# Patient Record
Sex: Male | Born: 1955 | Race: White | Hispanic: No | Marital: Married | Smoking: Never smoker
Health system: Southern US, Community
[De-identification: ages and names within clinical notes are randomized; demographics above are authoritative.]

## PROBLEM LIST (undated history)

## (undated) DIAGNOSIS — F329 Major depressive disorder, single episode, unspecified: Secondary | ICD-10-CM

## (undated) DIAGNOSIS — E119 Type 2 diabetes mellitus without complications: Secondary | ICD-10-CM

## (undated) DIAGNOSIS — I10 Essential (primary) hypertension: Secondary | ICD-10-CM

## (undated) DIAGNOSIS — F32A Depression, unspecified: Secondary | ICD-10-CM

## (undated) DIAGNOSIS — C801 Malignant (primary) neoplasm, unspecified: Secondary | ICD-10-CM

## (undated) HISTORY — PX: NEPHRECTOMY: SHX65

## (undated) HISTORY — PX: CHOLECYSTECTOMY: SHX55

## (undated) HISTORY — PX: ELBOW SURGERY: SHX618

---

## 2010-08-06 HISTORY — PX: OTHER SURGICAL HISTORY: SHX169

## 2013-02-04 ENCOUNTER — Observation Stay (HOSPITAL_COMMUNITY)
Admission: EM | Admit: 2013-02-04 | Discharge: 2013-02-06 | Disposition: A | Discharge status: Home / Self Care | Payer: Medicaid Other | Attending: Family Medicine | Admitting: Family Medicine

## 2013-02-04 ENCOUNTER — Encounter (HOSPITAL_COMMUNITY): Payer: Self-pay | Admitting: Emergency Medicine

## 2013-02-04 ENCOUNTER — Emergency Department (HOSPITAL_COMMUNITY): Payer: Medicaid Other

## 2013-02-04 DIAGNOSIS — F3289 Other specified depressive episodes: Secondary | ICD-10-CM | POA: Insufficient documentation

## 2013-02-04 DIAGNOSIS — Z79899 Other long term (current) drug therapy: Secondary | ICD-10-CM | POA: Insufficient documentation

## 2013-02-04 DIAGNOSIS — F329 Major depressive disorder, single episode, unspecified: Secondary | ICD-10-CM | POA: Diagnosis present

## 2013-02-04 DIAGNOSIS — M549 Dorsalgia, unspecified: Secondary | ICD-10-CM | POA: Diagnosis present

## 2013-02-04 DIAGNOSIS — E119 Type 2 diabetes mellitus without complications: Secondary | ICD-10-CM | POA: Insufficient documentation

## 2013-02-04 DIAGNOSIS — I10 Essential (primary) hypertension: Secondary | ICD-10-CM | POA: Diagnosis present

## 2013-02-04 DIAGNOSIS — R55 Syncope and collapse: Principal | ICD-10-CM | POA: Insufficient documentation

## 2013-02-04 HISTORY — DX: Major depressive disorder, single episode, unspecified: F32.9

## 2013-02-04 HISTORY — DX: Type 2 diabetes mellitus without complications: E11.9

## 2013-02-04 HISTORY — DX: Essential (primary) hypertension: I10

## 2013-02-04 HISTORY — DX: Depression, unspecified: F32.A

## 2013-02-04 LAB — CBC WITH DIFFERENTIAL/PLATELET
Basophils Absolute: 0 10*3/uL (ref 0.0–0.1)
Basophils Relative: 0 % (ref 0–1)
Eosinophils Absolute: 0.2 10*3/uL (ref 0.0–0.7)
Eosinophils Relative: 3 % (ref 0–5)
HCT: 37.3 % — ABNORMAL LOW (ref 39.0–52.0)
Hemoglobin: 12.8 g/dL — ABNORMAL LOW (ref 13.0–17.0)
Lymphocytes Relative: 25 % (ref 12–46)
Lymphs Abs: 2 10*3/uL (ref 0.7–4.0)
MCH: 28.2 pg (ref 26.0–34.0)
MCHC: 34.3 g/dL (ref 30.0–36.0)
MCV: 82.2 fL (ref 78.0–100.0)
Monocytes Absolute: 0.4 10*3/uL (ref 0.1–1.0)
Monocytes Relative: 5 % (ref 3–12)
Neutro Abs: 5.5 10*3/uL (ref 1.7–7.7)
Neutrophils Relative %: 67 % (ref 43–77)
Platelets: 157 10*3/uL (ref 150–400)
RBC: 4.54 MIL/uL (ref 4.22–5.81)
RDW: 14.2 % (ref 11.5–15.5)
WBC: 8.2 10*3/uL (ref 4.0–10.5)

## 2013-02-04 LAB — URINALYSIS, ROUTINE W REFLEX MICROSCOPIC
Bilirubin Urine: NEGATIVE
Glucose, UA: NEGATIVE mg/dL
Hgb urine dipstick: NEGATIVE
Ketones, ur: NEGATIVE mg/dL
Leukocytes, UA: NEGATIVE
Nitrite: NEGATIVE
Protein, ur: NEGATIVE mg/dL
Specific Gravity, Urine: 1.005 (ref 1.005–1.030)
Urobilinogen, UA: 4 mg/dL — ABNORMAL HIGH (ref 0.0–1.0)
pH: 6.5 (ref 5.0–8.0)

## 2013-02-04 LAB — TYPE AND SCREEN
ABO/RH(D): O NEG
Antibody Screen: NEGATIVE

## 2013-02-04 LAB — TROPONIN I
Troponin I: <0.3 ng/mL (ref <0.30)
Troponin I: <0.3 ng/mL (ref <0.30)

## 2013-02-04 LAB — BASIC METABOLIC PANEL
BUN: 9 mg/dL (ref 6–23)
CO2: 25 mEq/L (ref 19–32)
Calcium: 9.4 mg/dL (ref 8.4–10.5)
Chloride: 102 mEq/L (ref 96–112)
Creatinine, Ser: 1.19 mg/dL (ref 0.50–1.35)
GFR calc Af Amer: 77 mL/min — ABNORMAL LOW (ref >90)
GFR calc non Af Amer: 67 mL/min — ABNORMAL LOW (ref >90)
Glucose, Bld: 80 mg/dL (ref 70–99)
Potassium: 4.2 mEq/L (ref 3.5–5.1)
Sodium: 139 mEq/L (ref 135–145)

## 2013-02-04 LAB — CG4 I-STAT (LACTIC ACID): Lactic Acid, Venous: 2.16 mmol/L (ref 0.5–2.2)

## 2013-02-04 LAB — ABO/RH: ABO/RH(D): O NEG

## 2013-02-04 MED ORDER — SODIUM CHLORIDE 0.9 % IJ SOLN
3.0000 mL | Freq: Two times a day (BID) | INTRAMUSCULAR | Status: DC
  Administered 2013-02-04 – 2013-02-05 (×2): 3 mL via INTRAVENOUS

## 2013-02-04 MED ORDER — MORPHINE SULFATE 4 MG/ML IJ SOLN
8.0000 mg | Freq: Once | INTRAMUSCULAR | Status: AC
  Administered 2013-02-04: 8 mg via INTRAVENOUS
  Filled 2013-02-04: qty 2

## 2013-02-04 MED ORDER — TEMAZEPAM 15 MG PO CAPS
30.0000 mg | ORAL_CAPSULE | Freq: Every day | ORAL | Status: DC
Start: 2013-02-04 — End: 2013-02-06
  Administered 2013-02-04 – 2013-02-05 (×2): 30 mg via ORAL
  Filled 2013-02-04 (×2): qty 2

## 2013-02-04 MED ORDER — SODIUM CHLORIDE 0.9 % IV BOLUS (SEPSIS)
1000.0000 mL | Freq: Once | INTRAVENOUS | Status: AC
  Administered 2013-02-04: 1000 mL via INTRAVENOUS

## 2013-02-04 MED ORDER — IOHEXOL 350 MG/ML SOLN
100.0000 mL | Freq: Once | INTRAVENOUS | Status: AC | PRN
  Administered 2013-02-04: 100 mL via INTRAVENOUS

## 2013-02-04 MED ORDER — GABAPENTIN 300 MG PO CAPS
300.0000 mg | ORAL_CAPSULE | Freq: Three times a day (TID) | ORAL | Status: DC
  Administered 2013-02-04 – 2013-02-06 (×5): 300 mg via ORAL
  Filled 2013-02-04 (×8): qty 1

## 2013-02-04 MED ORDER — MORPHINE SULFATE 4 MG/ML IJ SOLN
4.0000 mg | Freq: Once | INTRAMUSCULAR | Status: AC
  Administered 2013-02-04: 4 mg via INTRAVENOUS
  Filled 2013-02-04: qty 1

## 2013-02-04 MED ORDER — TRAZODONE HCL 150 MG PO TABS
150.0000 mg | ORAL_TABLET | Freq: Two times a day (BID) | ORAL | Status: DC
  Administered 2013-02-04: 150 mg via ORAL
  Filled 2013-02-04 (×3): qty 1

## 2013-02-04 MED ORDER — INSULIN ASPART 100 UNIT/ML ~~LOC~~ SOLN: 0.0000 [IU] | Freq: Three times a day (TID) | SUBCUTANEOUS | Status: DC

## 2013-02-04 MED ORDER — SODIUM CHLORIDE 0.9 % IV SOLN: INTRAVENOUS | Status: DC

## 2013-02-04 MED ORDER — MORPHINE SULFATE 4 MG/ML IJ SOLN
4.0000 mg | INTRAMUSCULAR | Status: DC | PRN
  Administered 2013-02-05: 4 mg via INTRAVENOUS
  Filled 2013-02-04: qty 1

## 2013-02-04 MED ORDER — BUSPIRONE HCL 10 MG PO TABS
10.0000 mg | ORAL_TABLET | Freq: Two times a day (BID) | ORAL | Status: DC
  Administered 2013-02-04 – 2013-02-06 (×4): 10 mg via ORAL
  Filled 2013-02-04 (×5): qty 1

## 2013-02-04 MED ORDER — ACETAMINOPHEN 650 MG RE SUPP: 650.0000 mg | Freq: Four times a day (QID) | RECTAL | Status: DC | PRN

## 2013-02-04 MED ORDER — LISINOPRIL 5 MG PO TABS
5.0000 mg | ORAL_TABLET | Freq: Every day | ORAL | Status: DC
  Administered 2013-02-04 – 2013-02-05 (×2): 5 mg via ORAL
  Filled 2013-02-04 (×3): qty 1

## 2013-02-04 MED ORDER — IOHEXOL 350 MG/ML SOLN: 100.0000 mL | Freq: Once | INTRAVENOUS | Status: DC | PRN

## 2013-02-04 MED ORDER — HEPARIN SODIUM (PORCINE) 5000 UNIT/ML IJ SOLN
5000.0000 [IU] | Freq: Three times a day (TID) | INTRAMUSCULAR | Status: DC
  Administered 2013-02-04 – 2013-02-06 (×5): 5000 [IU] via SUBCUTANEOUS
  Filled 2013-02-04 (×8): qty 1

## 2013-02-04 MED ORDER — ACETAMINOPHEN 325 MG PO TABS: 650.0000 mg | ORAL_TABLET | Freq: Four times a day (QID) | ORAL | Status: DC | PRN

## 2013-02-04 MED ORDER — ASPIRIN EC 81 MG PO TBEC
81.0000 mg | DELAYED_RELEASE_TABLET | Freq: Every day | ORAL | Status: DC
  Administered 2013-02-04 – 2013-02-06 (×3): 81 mg via ORAL
  Filled 2013-02-04 (×3): qty 1

## 2013-02-04 MED ORDER — LURASIDONE HCL 40 MG PO TABS
40.0000 mg | ORAL_TABLET | Freq: Every day | ORAL | Status: DC
  Administered 2013-02-05 – 2013-02-06 (×2): 40 mg via ORAL
  Filled 2013-02-04 (×3): qty 1

## 2013-02-04 MED ORDER — METFORMIN HCL 500 MG PO TABS
500.0000 mg | ORAL_TABLET | Freq: Two times a day (BID) | ORAL | Status: DC
  Filled 2013-02-04 (×3): qty 1

## 2013-02-04 MED ORDER — DOXEPIN HCL 50 MG PO CAPS
50.0000 mg | ORAL_CAPSULE | Freq: Every day | ORAL | Status: DC
  Administered 2013-02-04 – 2013-02-05 (×2): 50 mg via ORAL
  Filled 2013-02-04 (×3): qty 1

## 2013-02-04 MED ORDER — ONDANSETRON HCL 4 MG/2ML IJ SOLN
4.0000 mg | Freq: Once | INTRAMUSCULAR | Status: AC
  Administered 2013-02-04: 4 mg via INTRAVENOUS
  Filled 2013-02-04: qty 2

## 2013-02-04 NOTE — ED Notes (Signed)
Pt states he passed out at his sisters house, he felt lightheaded and dizzy, but does not think he hit his head. Pt also states he had some shortness of breath but no more than usual.

## 2013-02-04 NOTE — ED Provider Notes (Signed)
History    57 year old male w/ syncope x2. Woke up this morning and just didn't feel like normal self. Fatigue and mild nausea. Was at sister's house walking when he passed out. Preceeded by a few seconds of diaphoresis and nausea before he fell to the ground. This was witnessed by family. He states that they told him that he was unconscious for a period of about a minute. No seizure activity. No oral trauma. No incontinence. Quick return to baseline. Similar symptom shortly later while in a seated position. He denies any preceding pain, but that after the first syncopal event he noticed pain in his back from between his shoulder blades extending down to lower back. Feels like a throbbing. Has been persistent since. No SOB. No unusual leg pain or swelling. Hx of HTN and DM. Denies any known Hx of CAD.   CSN: [id number] Arrival date & time 02/04/13  1115  First MD Initiated Contact with Patient 02/04/13 1134     Chief Complaint  Patient presents with  . Loss of Consciousness  . Back Pain   (Consider location/radiation/quality/duration/timing/severity/associated sxs/prior Treatment) HPI Past Medical History  Diagnosis Date  . Hypertension   . Diabetes mellitus without complication   . Depression    History reviewed. No pertinent past surgical history. History reviewed. No pertinent family history. History  Substance Use Topics  . Smoking status: Never Smoker   . Smokeless tobacco: Not on file  . Alcohol Use: No    Review of Systems All systems reviewed and negative, other than as noted in HPI.   Allergies  Penicillins  Home Medications  No current outpatient prescriptions on file. BP 144/105  Pulse 115  Temp(Src) 98.3 F (36.8 C) (Oral)  Resp 18  SpO2 96% Physical Exam  Nursing note and vitals reviewed. Constitutional: No distress.  Laying in bed. NAD. Obese.   HENT:  Head: Normocephalic and atraumatic.  Eyes: Conjunctivae are normal. Right eye exhibits no  discharge. Left eye exhibits no discharge.  Neck: Neck supple.  Cardiovascular: Regular rhythm and normal heart sounds.  Exam reveals no gallop and no friction rub.   No murmur heard. Tachycardic. No murmur appreciated by exam limited by robust body habitus.   Pulmonary/Chest: Effort normal and breath sounds normal. No respiratory distress.  Abdominal: Soft. He exhibits no distension. There is no tenderness.  Musculoskeletal: He exhibits no edema and no tenderness.  Neurological: He is alert.  Skin: Skin is warm and dry. He is not diaphoretic.  Psychiatric: He has a normal mood and affect. His behavior is normal. Thought content normal.    ED Course  Procedures (including critical care time) Labs Reviewed  BASIC METABOLIC PANEL - Abnormal; Notable for the following:    GFR calc non Af Amer 67 (*)    GFR calc Af Amer 77 (*)    All other components within normal limits  URINALYSIS, ROUTINE W REFLEX MICROSCOPIC - Abnormal; Notable for the following:    Urobilinogen, UA 4.0 (*)    All other components within normal limits  TROPONIN I  CG4 I-STAT (LACTIC ACID)  TYPE AND SCREEN  ABO/RH   Dg Chest 2 View  02/04/2013   *RADIOLOGY REPORT*  Clinical Data: Syncope, back pain  CHEST - 2 VIEW  Comparison: Portable chest x-ray of 12/08/2012  Findings: No active infiltrate or effusion is seen.  There is some prominence of the right paratracheal soft tissues, but this appears unchanged compared to the chest x-ray from November 2013  and probably represents overlapping vascular structures.  Otherwise mediastinal contours remained stable.  The heart is mildly enlarged and stable.  No bony abnormality is seen. Slight irregularity of the mid right clavicle may be due to prior trauma.  IMPRESSION: No active lung disease.  Stable mild cardiomegaly.   Original Report Authenticated By: Dwyane Dee, M.D.   Ct Angio Chest W/cm &/or Wo Cm  02/04/2013   *RADIOLOGY REPORT*  Clinical Data:  Rule out aortic dissection   CT ANGIOGRAPHY CHEST, ABDOMEN AND PELVIS  Technique:  Multidetector CT imaging through the chest, abdomen and pelvis was performed using the standard protocol during bolus administration of intravenous contrast.  Multiplanar reconstructed images including MIPs were obtained and reviewed to evaluate the vascular anatomy.  Contrast: OMNIPAQUE IOHEXOL 350 MG/ML SOLN  Comparison:   None.  CTA CHEST  Findings:  No pleural effusion identified.  There is no airspace consolidation or atelectasis.  No pulmonary nodule or mass identified.  Trachea is patent and is midline.  There is mild cardiac enlargement.  No pericardial effusion.  No mediastinal or hilar adenopathy.  The main pulmonary artery appears patent.  No lobar or proximal segmental filling defects identified to suggest an acute pulmonary embolus.  The thoracic aorta appears normal in course and caliber.  No evidence for dissection.  There is no axillary or supraclavicular adenopathy.  The visualized bony structures are remarkable for age indeterminate right anterior second rib fracture.  There is spondylosis noted within the thoracic spine.   Review of the MIP images confirms the above findings.  IMPRESSION:  1.  No evidence for aortic dissection.  CTA ABDOMEN AND PELVIS  Findings:  No focal liver abnormality identified.  Prior cholecystectomy.  No biliary dilatation.  Normal appearance of the pancreas.  The spleen is normal.  The right adrenal gland is normal.  Previous left nephrectomy.  Again noted is a mass within the left renal bed.  This measures 8.1 x 5.7 cm and is not significantly changed when compared with the previous exam.  Areas of macroscopic fat are identified within this mass as before.  Cyst within the mid pole of the right kidney measures 1.4 cm, image 124/series 4.  The urinary bladder appears normal.  Prostate gland and seminal vesicles are unremarkable.  Normal caliber of the abdominal aorta.  No aneurysm or evidence of aortic  dissection.  No upper abdominal adenopathy identified. There is no pelvic or inguinal adenopathy.  No free fluid or abnormal fluid collections identified within the abdomen or pelvis.  The stomach appears normal.  The small bowel loops have a normal course and caliber without evidence for obstruction.  The appendix is visualized and appears normal.  The normal appearance of the colon.  Review of the visualized osseous structures is significant for lumbar degenerative disc disease.  This is most severe at L5-S1. Hemangioma is identified within the L5 vertebra.   Review of the MIP images confirms the above findings.  IMPRESSION:  1.  No evidence for aortic dissection. 2.  No significant change and size of the left sided retroperitoneal mass situated in the left renal fossa. 2 year stability favors a benign abnormality.  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   Original Report Authenticated By: Signa Kell, M.D.   Ct Cta Abd/pel W/cm &/or W/o Cm  02/04/2013   *RADIOLOGY REPORT*  Clinical Data:  Rule out aortic dissection  CT ANGIOGRAPHY CHEST, ABDOMEN AND PELVIS  Technique:  Multidetector CT  imaging through the chest, abdomen and pelvis was performed using the standard protocol during bolus administration of intravenous contrast.  Multiplanar reconstructed images including MIPs were obtained and reviewed to evaluate the vascular anatomy.  Contrast: OMNIPAQUE IOHEXOL 350 MG/ML SOLN  Comparison:   None.  CTA CHEST  Findings:  No pleural effusion identified.  There is no airspace consolidation or atelectasis.  No pulmonary nodule or mass identified.  Trachea is patent and is midline.  There is mild cardiac enlargement.  No pericardial effusion.  No mediastinal or hilar adenopathy.  The main pulmonary artery appears patent.  No lobar or proximal segmental filling defects identified to suggest an acute pulmonary embolus.  The thoracic aorta appears normal in course and caliber.   No evidence for dissection.  There is no axillary or supraclavicular adenopathy.  The visualized bony structures are remarkable for age indeterminate right anterior second rib fracture.  There is spondylosis noted within the thoracic spine.   Review of the MIP images confirms the above findings.  IMPRESSION:  1.  No evidence for aortic dissection.  CTA ABDOMEN AND PELVIS  Findings:  No focal liver abnormality identified.  Prior cholecystectomy.  No biliary dilatation.  Normal appearance of the pancreas.  The spleen is normal.  The right adrenal gland is normal.  Previous left nephrectomy.  Again noted is a mass within the left renal bed.  This measures 8.1 x 5.7 cm and is not significantly changed when compared with the previous exam.  Areas of macroscopic fat are identified within this mass as before.  Cyst within the mid pole of the right kidney measures 1.4 cm, image 124/series 4.  The urinary bladder appears normal.  Prostate gland and seminal vesicles are unremarkable.  Normal caliber of the abdominal aorta.  No aneurysm or evidence of aortic dissection.  No upper abdominal adenopathy identified. There is no pelvic or inguinal adenopathy.  No free fluid or abnormal fluid collections identified within the abdomen or pelvis.  The stomach appears normal.  The small bowel loops have a normal course and caliber without evidence for obstruction.  The appendix is visualized and appears normal.  The normal appearance of the colon.  Review of the visualized osseous structures is significant for lumbar degenerative disc disease.  This is most severe at L5-S1. Hemangioma is identified within the L5 vertebra.   Review of the MIP images confirms the above findings.  IMPRESSION:  1.  No evidence for aortic dissection. 2.  No significant change and size of the left sided retroperitoneal mass situated in the left renal fossa. 2 year stability favors a benign abnormality.  This may represent a large adrenal myelolipoma with  previous hemorrhage.   Original Report Authenticated By: Signa Kell, M.D.   EKG:  Rhythm: sinus tachycardia Vent. rate 115 BPM PR interval 166 ms QRS duration 90 ms QT/QTc 322/445 ms abnormal r wave progression ST segments: NS ST changes in III Comparison: none   1. Syncope     MDM  57yM with syncope x2 and back pain. Back pain is reproducible on palpation and may be related to his fall. Constellation of symptoms concerning for possible dissection though. Will CT to eval for this. EKG with sinus tach, otherwise reassuring.   CT w/o acute pathology. Tachycardia resolved. Admit for further eval of syncope. Discussed with family medicine.   Raeford Razor, MD 02/09/13 [phone]

## 2013-02-04 NOTE — H&P (Signed)
Family Medicine Teaching [facility] Admission History and Physical Service Pager: [phone]  Patient name: Kevin Zhang Medical record number: [id number] Date of birth: Aug 02, 1956 Age: 57 y.o. Gender: male  Primary Care Provider: [facility] - Dr. Alvester Zhang Consultants: None Code Status: Full  Chief Complaint: Syncope  Assessment and Plan: Kevin Zhang is a 57 y.o. year old male presenting with two syncopal episodes . PMH is significant for HTN, DM and depression.  # Syncope- Unprovoked syncopal episodes, as far as I can tell. His labs, vital signs and imaging are unremarkable. DDx includes arrythmia, vasovagal syncope, psychogenic syncope, seizure activity (witnessed syncope with no signs of seizure and rapid return to baseline), TIA, infectious process (less likely given normal lactic acid and non-ill appearing patient). Might also be situational since he was outside on a hot day moving around more than usual. Patient also appears to be on many psych meds, so concern for QT prolongation especially with Latuda.  - Admit to telemetry, attending Dr. Denny Levy - EKG shows QTc of 445 which is not prolonged but close, repeat if has any further episodes as well as in AM - Echo given long standing HTN. Patient states he had cardiac work up in the past that was unremarkable, but will look for structural abnormalities - Orthostatic vital signs on admission - Needs CBC (previous lab canceled) - Gentle IVF - Check TSH and troponin  - May consider outpatient cards follow up if work up negative  # Back Pain- Thoracic back pain, likely related to fall at time of syncope. Patient had a CT to rule out aortic dissection given his history, which was negative. - Treat pain with Tylenol and Kpad for symptomatic relief - Morphine only as needed for severe pain  # HTN- BP elevated on arrival to the ED, but has now stablizined - Continue home regimen of Lisinopril - Monitor per  floor protocol  # DM-  - A1C with next labs - Continue home metformin  - SSI for additional coverage  # Depression- On multiple agents, particularly at night. Well controlled, per patient. Followed by mental health. Denies missing any doses except today's doses. I am hesitant to change anything at this time. - Continue home regimen of Buspar 10mg  BID, Doxepin 50mg  qhs, Latuda 40mg  qam, Restoril 30mg  qhs and Trazodone 150mg  BID.   FEN/GI: Carb modified diet. NS @ 75 cc/hr Prophylaxis: Heparin sq  Disposition: Home pending further work up and observation  History of Present Illness: Kevin Zhang is a 57 y.o. year old male presenting with syncope.  Patient states that he was at his sisters house outside with the grandkids and had 2 syncopal episodes. He states the episodes were 15 minutes apart. Both episodes were witnessed, and witnesses state he just fell down. (no tremors, or signs of seizure activity.) Patient states he was told he was out about 2 minutes. He does not remember anything except his family members helping him stand up. He states he was hot and sweaty from being outside, but felt like a "cold sweat" before both episodes happened. Denies CP but states he felt his heart beating fast and felt light headed.  Patient states he has not had anything at all to eat today. He had a little bit to drink today including some water and a bottle of diet mountain dew. He has not taken any medications today.  On further questioning, patient states he had an episode like this about a year ago.  He went to [facility] in [place], [place] at that time. He states that he was observed overnight and nothing was found so he was sent home. He does not remember any inciting events at that time. Denies any history of cardiac problems other than chest pains a few years ago. States he had a full cardiac work up at [facility] which was negative including stress test and cardiac cath.   No recent  illnesses or fevers. No sick contacts.  Review Of Systems: Per HPI with the following additions: Headaches for the last week (wears CPAP at night which he states is working fine.) No rashes, bug bites or tick bites. Otherwise 12 point review of systems was performed and was unremarkable.  Patient Active Problem List   Diagnosis Date Noted  . Syncope 02/04/2013  . HTN (hypertension) 02/04/2013  . Diabetes mellitus 02/04/2013  . Depression 02/04/2013  . Back pain 02/04/2013   Past Medical History: Past Medical History  Diagnosis Date  . Hypertension   . Diabetes mellitus without complication   . Depression    Past Surgical History: Past Surgical History  Procedure Laterality Date  . Kidney removed  2012    Left renal cancer  . Elbow surgery      Left elbow to remove old ortho hardware  . Cholecystectomy     Social History: History  Substance Use Topics  . Smoking status: Never Smoker   . Smokeless tobacco: Not on file  . Alcohol Use: No   Additional social history:  History   Social History Narrative   Lives in [place], [place] with wife and roommate. Visiting sister in [place].     Please also refer to relevant sections of EMR.  Family History: Family History  Problem Relation Age of Onset  . Heart attack Mother 61  . Heart attack Father 70   Allergies and Medications: Allergies  Allergen Reactions  . Penicillins     childhood   Prior to Admission medications   Medication Sig Start Date End Date Taking? Authorizing Provider  busPIRone (BUSPAR) 5 MG tablet Take 10 mg by mouth 2 (two) times daily.   Yes Historical Provider, MD  clotrimazole (LOTRIMIN) 1 % cream Apply 1 application topically daily.   Yes Historical Provider, MD  doxepin (SINEQUAN) 50 MG capsule Take 50 mg by mouth at bedtime.   Yes Historical Provider, MD  gabapentin (NEURONTIN) 300 MG capsule Take 300 mg by mouth 3 (three) times daily.   Yes Historical Provider, MD  HYDROcodone-acetaminophen  (NORCO) 10-325 MG per tablet Take 1 tablet by mouth every 4 (four) hours as needed for pain.   Yes Historical Provider, MD  lisinopril (PRINIVIL,ZESTRIL) 5 MG tablet Take 5 mg by mouth daily.   Yes Historical Provider, MD  lurasidone (LATUDA) 40 MG TABS Take 40 mg by mouth daily with breakfast.   Yes Historical Provider, MD  metFORMIN (GLUCOPHAGE) 500 MG tablet Take 500 mg by mouth 2 (two) times daily with a meal.   Yes Historical Provider, MD  naproxen (NAPROSYN) 500 MG tablet Take 500 mg by mouth 2 (two) times daily with a meal.   Yes Historical Provider, MD  promethazine (PHENERGAN) 25 MG tablet Take 25 mg by mouth 3 (three) times daily as needed for nausea.   Yes Historical Provider, MD  temazepam (RESTORIL) 30 MG capsule Take 30 mg by mouth at bedtime.   Yes Historical Provider, MD  traZODone (DESYREL) 150 MG tablet Take 150 mg by mouth 2 (two)  times daily.   Yes Historical Provider, MD   Objective: BP 155/65  Pulse 83  Temp(Src) 98.3 F (36.8 C) (Oral)  Resp 18  Ht 5\' 10"  (1.778 m)  Wt 355 lb 3.2 oz (161.118 kg)  BMI 50.97 kg/m2  SpO2 95% Exam: General: Sitting up in bed, NAD. Watching TV with minimal eye contact HEENT: MMM. Edentulous. Wears glasses. Neck: No true bruit appreciated although left side more audible than right  Cardiovascular: RRR. No murmur or irregular beats Respiratory: CTAB, no wheezes Abdomen: Obese, soft, nontender Back: TTP of thoracic and lumbar paraspinal muscles Extremities: Moves all extremities. Large scars on left forearm and mild deformity of left hand. Trace edema lower extremities with some chronic skin changes. Right great toe erythematous from ingrown nail Neuro: Good grip on the right. Left grip decreased due to chronic atrophy from previous trauma. Otherwise, no focal findings. Psych: Answers questions appropriately. No tangential thoughts or pressured speech. Makes minimal eye contact.  Labs and Imaging: CBC BMET  No results found for this  basename: WBC, HGB, HCT, PLT,  in the last 168 hours  Recent Labs Lab 02/04/13 1144  NA 139  K 4.2  CL 102  CO2 25  BUN 9  CREATININE 1.19  GLUCOSE 80  CALCIUM 9.4    Dg Chest 2 View  02/04/2013   *RADIOLOGY REPORT*  Clinical Data: Syncope, back pain  CHEST - 2 VIEW  Comparison: Portable chest x-ray of 12/08/2012  Findings: No active infiltrate or effusion is seen.  There is some prominence of the right paratracheal soft tissues, but this appears unchanged compared to the chest x-ray from November 2013 and probably represents overlapping vascular structures.  Otherwise mediastinal contours remained stable.  The heart is mildly enlarged and stable.  No bony abnormality is seen. Slight irregularity of the mid right clavicle may be due to prior trauma.  IMPRESSION: No active lung disease.  Stable mild cardiomegaly.   Original Report Authenticated By: Dwyane Dee, M.D.   Ct Angio Chest W/cm &/or Wo Cm  02/04/2013   *RADIOLOGY REPORT*  Clinical Data:  Rule out aortic dissection  CT ANGIOGRAPHY CHEST, ABDOMEN AND PELVIS  Technique:  Multidetector CT imaging through the chest, abdomen and pelvis was performed using the standard protocol during bolus administration of intravenous contrast.  Multiplanar reconstructed images including MIPs were obtained and reviewed to evaluate the vascular anatomy.  Contrast: OMNIPAQUE IOHEXOL 350 MG/ML SOLN  Comparison:   None.  CTA CHEST  Findings:  No pleural effusion identified.  There is no airspace consolidation or atelectasis.  No pulmonary nodule or mass identified.  Trachea is patent and is midline.  There is mild cardiac enlargement.  No pericardial effusion.  No mediastinal or hilar adenopathy.  The main pulmonary artery appears patent.  No lobar or proximal segmental filling defects identified to suggest an acute pulmonary embolus.  The thoracic aorta appears normal in course and caliber.  No evidence for dissection.  There is no axillary or supraclavicular  adenopathy.  The visualized bony structures are remarkable for age indeterminate right anterior second rib fracture.  There is spondylosis noted within the thoracic spine.   Review of the MIP images confirms the above findings.  IMPRESSION:  1.  No evidence for aortic dissection.  CTA ABDOMEN AND PELVIS  Findings:  No focal liver abnormality identified.  Prior cholecystectomy.  No biliary dilatation.  Normal appearance of the pancreas.  The spleen is normal.  The right adrenal gland is normal.  Previous left nephrectomy.  Again noted is a mass within the left renal bed.  This measures 8.1 x 5.7 cm and is not significantly changed when compared with the previous exam.  Areas of macroscopic fat are identified within this mass as before.  Cyst within the mid pole of the right kidney measures 1.4 cm, image 124/series 4.  The urinary bladder appears normal.  Prostate gland and seminal vesicles are unremarkable.  Normal caliber of the abdominal aorta.  No aneurysm or evidence of aortic dissection.  No upper abdominal adenopathy identified. There is no pelvic or inguinal adenopathy.  No free fluid or abnormal fluid collections identified within the abdomen or pelvis.  The stomach appears normal.  The small bowel loops have a normal course and caliber without evidence for obstruction.  The appendix is visualized and appears normal.  The normal appearance of the colon.  Review of the visualized osseous structures is significant for lumbar degenerative disc disease.  This is most severe at L5-S1. Hemangioma is identified within the L5 vertebra.   Review of the MIP images confirms the above findings.  IMPRESSION:  1.  No evidence for aortic dissection. 2.  No significant change and size of the left sided retroperitoneal mass situated in the left renal fossa. 2 year stability favors a benign abnormality.  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   Original Report Authenticated By: Signa Kell,  M.D.   Hilarie Fredrickson, MD 02/04/2013, 6:47 PM PGY-3, [facility] FPTS Intern pager: [phone], text pages welcome

## 2013-02-04 NOTE — ED Notes (Signed)
Dr. Kohut at bedside 

## 2013-02-04 NOTE — ED Notes (Signed)
Pt c/o syncope x 2 today; pt sts fall with back pain and shoulder pain; pt denies CP or SOB

## 2013-02-05 LAB — GLUCOSE, CAPILLARY
Glucose-Capillary: 121 mg/dL — ABNORMAL HIGH (ref 70–99)
Glucose-Capillary: 112 mg/dL — ABNORMAL HIGH (ref 70–99)

## 2013-02-05 LAB — TSH: TSH: 2.352 u[IU]/mL (ref 0.350–4.500)

## 2013-02-05 LAB — HEMOGLOBIN A1C
Hgb A1c MFr Bld: 5.3 % (ref <5.7)
Mean Plasma Glucose: 105 mg/dL (ref <117)

## 2013-02-05 MED ORDER — TRAZODONE HCL 150 MG PO TABS: 150.0000 mg | ORAL_TABLET | Freq: Every day | ORAL | Status: AC

## 2013-02-05 MED ORDER — TRAZODONE HCL 150 MG PO TABS
150.0000 mg | ORAL_TABLET | Freq: Every day | ORAL | Status: DC
  Administered 2013-02-05: 150 mg via ORAL
  Filled 2013-02-05 (×2): qty 1

## 2013-02-05 MED ORDER — HYDROCODONE-ACETAMINOPHEN 10-325 MG PO TABS
1.0000 | ORAL_TABLET | ORAL | Status: DC | PRN
  Administered 2013-02-05 – 2013-02-06 (×4): 1 via ORAL
  Filled 2013-02-05 (×4): qty 1

## 2013-02-05 MED ORDER — METFORMIN HCL 500 MG PO TABS
500.0000 mg | ORAL_TABLET | Freq: Two times a day (BID) | ORAL | Status: DC
  Filled 2013-02-05 (×2): qty 1

## 2013-02-05 MED ORDER — ONDANSETRON HCL 4 MG/2ML IJ SOLN
4.0000 mg | Freq: Once | INTRAMUSCULAR | Status: AC
  Administered 2013-02-05: 4 mg via INTRAVENOUS
  Filled 2013-02-05: qty 2

## 2013-02-05 NOTE — Progress Notes (Signed)
Echocardiogram 2D Echocardiogram has been performed.  Arvil Chaco 02/05/2013, 10:20 AM

## 2013-02-05 NOTE — H&P (Signed)
[facility] Attending Admission Note: Denny Levy MD [phone] pager office [phone] I  have seen and examined this patient, reviewed their chart. I have discussed this patient with the resident. I agree with the resident's findings, assessment and care plan.I agree with the thought process outlined in Dr. Algis Downs note--I think most likely etiology of syncope is a combination of obesity with deconditioning combined with multiple psychiatric meds (with side effect of dysautoregulation of body temperature) in setting of unusual outdoor activity in 94 degree heat yesterday. I agree with ECHO to assess for unexpected new cardiac pathology  That would effect cardiac output.

## 2013-02-05 NOTE — Progress Notes (Signed)
Patient ID: Kevin Zhang, male   DOB: Nov 19, 1955, 57 y.o.   MRN: [id number] Family Medicine Teaching Service Daily Progress Note Intern Pager: [phone]  Patient name: Kevin Zhang [facility] Medical record number: [id number] Date of birth: 18-Jun-1956 Age: 57 y.o. Gender: male  Primary Care Provider: Default, Provider, MD Consultants: none Code Status: full  Pt Overview and Major Events to Date: Admitted 02/04/13 for syncope  02/05/13- on telemetry overnight with no events  Assessment and Plan:  # Syncope- Most likely cause is vasovagal. Workup negative so far. Patient on multiple psych meds which can cause orthostatic hypotension (doxepin and trazadone) as well as prolong Qtc (Latuda.)  - EKG does not show truly prolonged Qtc - Echo pending - Orthostatic vital signs have not been performed - May consider outpatient cards follow up if work up negative  - Also, will decrease Trazodone to once daily since his morning dose may be contributing   # Back Pain- Thoracic back pain, likely related to fall at time of syncope. Patient had a CT to rule out aortic dissection given his history, which was negative.  - Treat pain with Tylenol and Kpad for symptomatic relief  - Norco only as needed for severe pain   # HTN- BP stable - Continue home regimen of Lisinopril  - Monitor per floor protocol   # DM-  A1C 5.3  - Continue home metformin  - SSI for additional coverage   # Depression- On multiple agents, particularly at night. Well controlled, per patient. Followed by mental health.  - Continued on home regimen of Buspar 10mg  BID, Doxepin 50mg  qhs, Latuda 40mg  qam, Restoril 30mg  qhs and Trazodone 150mg  BID. - Will decrease Trazodone to qhs for now. He will need close follow up with outpatient provider  FEN/GI: Regular diet. SLIV PPx: Heparin  Disposition: Home pending negative work up. Will need outpatient follow up with PCP as well as mental health provider to discuss change in Trazodone.  Patient may likely benefit from cardiology appt if this continues.   Subjective: Patient states he is feeling better than yesterday. No further episodes. Patient also endorses some daytime sleepiness which may also come from Trazodone.  Objective: Temp:  [97.8 F (36.6 C)-98.3 F (36.8 C)] 97.8 F (36.6 C) (07/03 0506) Pulse Rate:  [82-115] 82 (07/03 0506) Resp:  [15-24] 20 (07/03 0506) BP: (113-155)/(54-105) 116/76 mmHg (07/03 0506) SpO2:  [93 %-97 %] 94 % (07/03 0506) Weight:  [355 lb 3.2 oz (161.118 kg)] 355 lb 3.2 oz (161.118 kg) (07/02 1838)  Physical Exam: General: Obese male, NAD Cardiovascular: RRR Respiratory: CTAB Abdomen: Nontender Extremities: Moves all extremities. Contracture of left hand  Laboratory:  Recent Labs Lab 02/04/13 1901  WBC 8.2  HGB 12.8*  HCT 37.3*  PLT 157    Recent Labs Lab 02/04/13 1144  NA 139  K 4.2  CL 102  CO2 25  BUN 9  CREATININE 1.19  CALCIUM 9.4  GLUCOSE 80   Imaging/Diagnostic Tests: Dg Chest 2 View  02/04/2013   *RADIOLOGY REPORT*  Clinical Data: Syncope, back pain  CHEST - 2 VIEW  Comparison: Portable chest x-ray of 12/08/2012  Findings: No active infiltrate or effusion is seen.  There is some prominence of the right paratracheal soft tissues, but this appears unchanged compared to the chest x-ray from November 2013 and probably represents overlapping vascular structures.  Otherwise mediastinal contours remained stable.  The heart is mildly enlarged and stable.  No bony abnormality is seen. Slight irregularity of  the mid right clavicle may be due to prior trauma.  IMPRESSION: No active lung disease.  Stable mild cardiomegaly.   Original Report Authenticated By: Dwyane Dee, M.D.   Ct Angio Chest W/cm &/or Wo Cm  02/04/2013   *RADIOLOGY REPORT*  Clinical Data:  Rule out aortic dissection  CT ANGIOGRAPHY CHEST, ABDOMEN AND PELVIS  Technique:  Multidetector CT imaging through the chest, abdomen and pelvis was performed using the  standard protocol during bolus administration of intravenous contrast.  Multiplanar reconstructed images including MIPs were obtained and reviewed to evaluate the vascular anatomy.  Contrast: OMNIPAQUE IOHEXOL 350 MG/ML SOLN  Comparison:   None.  CTA CHEST  Findings:  No pleural effusion identified.  There is no airspace consolidation or atelectasis.  No pulmonary nodule or mass identified.  Trachea is patent and is midline.  There is mild cardiac enlargement.  No pericardial effusion.  No mediastinal or hilar adenopathy.  The main pulmonary artery appears patent.  No lobar or proximal segmental filling defects identified to suggest an acute pulmonary embolus.  The thoracic aorta appears normal in course and caliber.  No evidence for dissection.  There is no axillary or supraclavicular adenopathy.  The visualized bony structures are remarkable for age indeterminate right anterior second rib fracture.  There is spondylosis noted within the thoracic spine.   Review of the MIP images confirms the above findings.  IMPRESSION:  1.  No evidence for aortic dissection.  CTA ABDOMEN AND PELVIS  Findings:  No focal liver abnormality identified.  Prior cholecystectomy.  No biliary dilatation.  Normal appearance of the pancreas.  The spleen is normal.  The right adrenal gland is normal.  Previous left nephrectomy.  Again noted is a mass within the left renal bed.  This measures 8.1 x 5.7 cm and is not significantly changed when compared with the previous exam.  Areas of macroscopic fat are identified within this mass as before.  Cyst within the mid pole of the right kidney measures 1.4 cm, image 124/series 4.  The urinary bladder appears normal.  Prostate gland and seminal vesicles are unremarkable.  Normal caliber of the abdominal aorta.  No aneurysm or evidence of aortic dissection.  No upper abdominal adenopathy identified. There is no pelvic or inguinal adenopathy.  No free fluid or abnormal fluid collections  identified within the abdomen or pelvis.  The stomach appears normal.  The small bowel loops have a normal course and caliber without evidence for obstruction.  The appendix is visualized and appears normal.  The normal appearance of the colon.  Review of the visualized osseous structures is significant for lumbar degenerative disc disease.  This is most severe at L5-S1. Hemangioma is identified within the L5 vertebra.   Review of the MIP images confirms the above findings.  IMPRESSION:  1.  No evidence for aortic dissection. 2.  No significant change and size of the left sided retroperitoneal mass situated in the left renal fossa. 2 year stability favors a benign abnormality.  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   Original Report Authenticated By: Signa Kell, M.D.   Hilarie Fredrickson, MD 02/05/2013, 9:04 AM PGY-3, [facility] FPTS Intern pager: [phone], text pages welcome

## 2013-02-05 NOTE — Discharge Summary (Signed)
Family Medicine Teaching [facility] Discharge Summary  Patient name: Kevin Zhang Medical record number: [id number] Date of birth: [birth year] Age: 57 y.o. Gender: male Date of Admission: 02/04/2013  Date of Discharge: 02/06/2013 Admitting Physician: Nestor Ramp, MD  Primary Care Provider: Default, Provider, MD Consultants: None  Indication for Hospitalization: Syncope  Discharge Diagnoses/Problem List:  Syncope HTN DM Back pain Depression  Disposition: Home  Discharge Condition: Stable  Brief Hospital Course: 57 yo M admitted with 2 syncopal episodes while outside in the heat. Patient has PMH of DM, HTN and depression (on 5 medications as an outpatient). He has had syncopal episodes in the past as well as a full cardiac workup at another hospital, per patient report.  # Syncope- Observed on telemetry with no events. EKG unchanged - QTc of 445. Given IVF. Patient overall feels better. This was most likely secondary to orthostatic hypotension in the setting of heat. Since patient is on multiple psych medications, we have decreased his Trazodone to only at bedtime to see if that will help with hypotension. Patient should follow up with his PCP as well as his mental health provider. If he continues to have issues, and outpatient Cardiology appointment would also be warranted. Patient stable and ambulating at discharge.  # DM- A1C of 5.3 Continued on Metformin  # HTN- BP stable on Lisinopril with no changes  # Depression- Continued on all home medications throughout hospitalization with exception of decreased Trazodone, as mentioned above. This was his only modification at time of discharge as well.  # Back Pain- Likely secondary to fall at time of syncope. No remarkable findings on exam or imaging. Treated in hospital with Tylenol and heat. Patient given exercises to do at home and should follow up with PCP if pain fails to improve.  Issues for Follow Up:  1. Decreased  Trazodone - please evaluate how patient is doing with change in dose 2. Consider outpatient cardiology work up  Significant Procedures: None  Significant Labs and Imaging:   Recent Labs Lab 02/04/13 1901  WBC 8.2  HGB 12.8*  HCT 37.3*  PLT 157    Recent Labs Lab 02/04/13 1144  NA 139  K 4.2  CL 102  CO2 25  GLUCOSE 80  BUN 9  CREATININE 1.19  CALCIUM 9.4   Dg Chest 2 View  02/04/2013   CHEST - 2 VIEW  Comparison: Portable chest x-ray of 12/08/2012  Findings: No active infiltrate or effusion is seen.  There is some prominence of the right paratracheal soft tissues, but this appears unchanged compared to the chest x-ray from November 2013 and probably represents overlapping vascular structures.  Otherwise mediastinal contours remained stable.  The heart is mildly enlarged and stable.  No bony abnormality is seen. Slight irregularity of the mid right clavicle may be due to prior trauma.  IMPRESSION: No active lung disease.  Stable mild cardiomegaly.     Ct Angio Chest W/cm &/or Wo Cm  02/04/2013    CTA CHEST  Findings:  No pleural effusion identified.  There is no airspace consolidation or atelectasis.  No pulmonary nodule or mass identified.  Trachea is patent and is midline.  There is mild cardiac enlargement.  No pericardial effusion.  No mediastinal or hilar adenopathy.  The main pulmonary artery appears patent.  No lobar or proximal segmental filling defects identified to suggest an acute pulmonary embolus.  The thoracic aorta appears normal in course and caliber.  No evidence for dissection.  There  is no axillary or supraclavicular adenopathy.  The visualized bony structures are remarkable for age indeterminate right anterior second rib fracture.  There is spondylosis noted within the thoracic spine.   Review of the MIP images confirms the above findings.    IMPRESSION:  1.  No evidence for aortic dissection.    CTA ABDOMEN AND PELVIS  Findings:  No focal liver abnormality  identified.  Prior cholecystectomy.  No biliary dilatation.  Normal appearance of the pancreas.  The spleen is normal.  The right adrenal gland is normal.  Previous left nephrectomy.  Again noted is a mass within the left renal bed.  This measures 8.1 x 5.7 cm and is not significantly changed when compared with the previous exam.  Areas of macroscopic fat are identified within this mass as before.  Cyst within the mid pole of the right kidney measures 1.4 cm, image 124/series 4.  The urinary bladder appears normal.  Prostate gland and seminal vesicles are unremarkable.  Normal caliber of the abdominal aorta.  No aneurysm or evidence of aortic dissection.  No upper abdominal adenopathy identified. There is no pelvic or inguinal adenopathy.  No free fluid or abnormal fluid collections identified within the abdomen or pelvis.  The stomach appears normal.  The small bowel loops have a normal course and caliber without evidence for obstruction.  The appendix is visualized and appears normal.  The normal appearance of the colon.  Review of the visualized osseous structures is significant for lumbar degenerative disc disease.  This is most severe at L5-S1. Hemangioma is identified within the L5 vertebra.   Review of the MIP images confirms the above findings.    IMPRESSION:  1.  No evidence for aortic dissection. 2.  No significant change and size of the left sided retroperitoneal mass situated in the left renal fossa. 2 year stability favors a benign abnormality.  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   Original Report Authenticated By: Signa Kell, M.D.   Outstanding Results: none  Discharge Medications:    Medication List         busPIRone 5 MG tablet  Commonly known as:  BUSPAR  Take 10 mg by mouth 2 (two) times daily.     clotrimazole 1 % cream  Commonly known as:  LOTRIMIN  Apply 1 application topically daily.     doxepin 50 MG capsule  Commonly known as:   SINEQUAN  Take 50 mg by mouth at bedtime.     gabapentin 300 MG capsule  Commonly known as:  NEURONTIN  Take 300 mg by mouth 3 (three) times daily.     HYDROcodone-acetaminophen 10-325 MG per tablet  Commonly known as:  NORCO  Take 1 tablet by mouth every 4 (four) hours as needed for pain.     lisinopril 5 MG tablet  Commonly known as:  PRINIVIL,ZESTRIL  Take 5 mg by mouth daily.     lurasidone 40 MG Tabs  Commonly known as:  LATUDA  Take 40 mg by mouth daily with breakfast.     metFORMIN 500 MG tablet  Commonly known as:  GLUCOPHAGE  Take 500 mg by mouth 2 (two) times daily with a meal.     naproxen 500 MG tablet  Commonly known as:  NAPROSYN  Take 500 mg by mouth 2 (two) times daily with a meal.     promethazine 25 MG tablet  Commonly known as:  PHENERGAN  Take 25 mg by mouth 3 (three)  times daily as needed for nausea.     temazepam 30 MG capsule  Commonly known as:  RESTORIL  Take 30 mg by mouth at bedtime.     traZODone 150 MG tablet  Commonly known as:  DESYREL  Take 1 tablet (150 mg total) by mouth at bedtime.        Discharge Instructions: Please refer to Patient Instructions section of EMR for full details.  Patient was counseled important signs and symptoms that should prompt return to medical care, changes in medications, dietary instructions, activity restrictions, and follow up appointments.   Follow-Up Appointments:     Follow-up Information   Schedule an appointment as soon as possible for a visit with Dr. Alvester Morin.      Schedule an appointment as soon as possible for a visit with Your mental health provider.     Jerl Munyan B. Jarvis Newcomer, MD PGY-1, [facility] 02/06/2013 2:18 PM

## 2013-02-05 NOTE — Progress Notes (Signed)
Utilization review completed. Kennice Finnie, RN, BSN. 

## 2013-02-05 NOTE — Progress Notes (Signed)
Family Medicine Teaching Service Attending Note  I discussed patient Kevin Zhang  with Dr. Paulina Fusi and reviewed their note for today.  I agree with their assessment and plan.

## 2013-02-06 LAB — GLUCOSE, CAPILLARY
Glucose-Capillary: 101 mg/dL — ABNORMAL HIGH (ref 70–99)
Glucose-Capillary: 124 mg/dL — ABNORMAL HIGH (ref 70–99)

## 2013-02-06 NOTE — Progress Notes (Signed)
Family Medicine Teaching Service Attending Note  I interviewed and examined patient Kevin Zhang and reviewed their tests and x-rays.  I discussed with Dr. Jarvis Newcomer and reviewed their note for today.  I agree with their assessment and plan.     Additionally  Stable for discharge No signs of cardiac cause of syncope

## 2013-02-06 NOTE — Discharge Summary (Signed)
Family Medicine Teaching Service Attending Note  I interviewed and examined patient Kevin Zhang and reviewed their tests and x-rays.  I discussed with Dr. Jarvis Newcomer and reviewed their note for today.  I agree with their assessment and plan.     Additionally  Stable for discharge

## 2013-02-06 NOTE — Progress Notes (Signed)
Patient ID: Kevin Zhang, male   DOB: November 24, 1955, 57 y.o.   MRN: [id number] Family Medicine Teaching Service Daily Progress Note Intern Pager: [phone]  Patient name: Kevin Zhang [facility] Medical record number: [id number] Date of birth: [birth year] Age: 57 y.o. Gender: male  Primary Care Provider: Default, Provider, MD Consultants: none Code Status: full  Pt Overview and Major Events to Date: Admitted 02/04/13 for syncope  02/05/13- on telemetry overnight with no events 02/06/13- D/C Home today with follow up with psych  Assessment and Plan:  # Syncope- Most likely cause is vasovagal. Workup negative so far. Patient on multiple psych meds which can cause orthostatic hypotension (doxepin and trazadone) as well as prolong Qtc (Latuda.)  - EKG does not show truly prolonged Qtc - Echo pending - Orthostatic vital signs have not been performed - May consider outpatient cards follow up if work up negative  - Also, will decrease Trazodone to once daily since his morning dose may be contributing   # Back Pain- Thoracic back pain, likely related to fall at time of syncope. Patient had a CT to rule out aortic dissection given his history, which was negative.  - Treat pain with Tylenol and Kpad for symptomatic relief  - Norco only as needed for severe pain   # HTN- BP stable - Continue home regimen of Lisinopril  - Monitor per floor protocol   # DM-  A1C 5.3  - Continue home metformin  - SSI for additional coverage   # Depression- On multiple agents, particularly at night. Well controlled, per patient. Followed by mental health.  - Continued on home regimen of Buspar 10mg  BID, Doxepin 50mg  qhs, Latuda 40mg  qam, Restoril 30mg  qhs and Trazodone 150mg  BID. - Will decrease Trazodone to qhs for now. He will need close follow up with outpatient provider  FEN/GI: Regular diet. SLIV PPx: Heparin  Disposition: D/C Home. Will need outpatient follow up with PCP as well as mental health provider to  discuss change in Trazodone. Patient may likely benefit from cardiology appt if this continues.   Subjective: Patient states he is feeling better than yesterday. No further episodes.  Objective: Temp:  [97.4 F (36.3 C)-97.7 F (36.5 C)] 97.5 F (36.4 C) (07/04 0630) Pulse Rate:  [72-93] 72 (07/04 0630) Resp:  [18] 18 (07/04 0630) BP: (99-132)/(61-90) 99/61 mmHg (07/04 0630) SpO2:  [93 %-100 %] 93 % (07/04 0630)  Physical Exam: General: Obese male, NAD Cardiovascular: RRR Respiratory: CTAB Abdomen: Nontender Extremities: Moves all extremities. Contracture of left hand Neruo: CN II-XII intact, visual field full to confrontation, gait normal  Laboratory:  Recent Labs Lab 02/04/13 1901  WBC 8.2  HGB 12.8*  HCT 37.3*  PLT 157    Recent Labs Lab 02/04/13 1144  NA 139  K 4.2  CL 102  CO2 25  BUN 9  CREATININE 1.19  CALCIUM 9.4  GLUCOSE 80   Imaging/Diagnostic Tests: Dg Chest 2 View  02/04/2013   *RADIOLOGY REPORT*  Clinical Data: Syncope, back pain  CHEST - 2 VIEW  Comparison: Portable chest x-ray of 12/08/2012  Findings: No active infiltrate or effusion is seen.  There is some prominence of the right paratracheal soft tissues, but this appears unchanged compared to the chest x-ray from November 2013 and probably represents overlapping vascular structures.  Otherwise mediastinal contours remained stable.  The heart is mildly enlarged and stable.  No bony abnormality is seen. Slight irregularity of the mid right clavicle may be due to prior trauma.  IMPRESSION: No active lung disease.  Stable mild cardiomegaly.   Original Report Authenticated By: Dwyane Dee, M.D.   Ct Angio Chest W/cm &/or Wo Cm  02/04/2013     CTA CHEST  IMPRESSION:  1.  No evidence for aortic dissection.   CTA ABDOMEN AND PELVIS 1.  No evidence for aortic dissection. 2.  No significant change and size of the left sided retroperitoneal mass situated in the left renal fossa. 2 year stability favors a  benign abnormality.  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     Hazeline Junker, MD 02/06/2013, 9:49 AM PGY-3, [facility] FPTS Intern pager: [phone], text pages welcome

## 2017-06-26 DIAGNOSIS — I25110 Atherosclerotic heart disease of native coronary artery with unstable angina pectoris: Secondary | ICD-10-CM

## 2017-06-26 DIAGNOSIS — I10 Essential (primary) hypertension: Secondary | ICD-10-CM

## 2017-06-26 DIAGNOSIS — R55 Syncope and collapse: Secondary | ICD-10-CM | POA: Diagnosis not present

## 2017-06-27 DIAGNOSIS — I25110 Atherosclerotic heart disease of native coronary artery with unstable angina pectoris: Secondary | ICD-10-CM | POA: Diagnosis not present

## 2017-06-27 DIAGNOSIS — I10 Essential (primary) hypertension: Secondary | ICD-10-CM | POA: Diagnosis not present

## 2017-06-27 DIAGNOSIS — R55 Syncope and collapse: Secondary | ICD-10-CM | POA: Diagnosis not present

## 2017-10-26 DIAGNOSIS — R55 Syncope and collapse: Secondary | ICD-10-CM | POA: Diagnosis not present

## 2017-10-27 DIAGNOSIS — R55 Syncope and collapse: Secondary | ICD-10-CM | POA: Diagnosis not present

## 2018-06-06 DIAGNOSIS — R112 Nausea with vomiting, unspecified: Secondary | ICD-10-CM | POA: Diagnosis not present

## 2018-06-06 DIAGNOSIS — R55 Syncope and collapse: Secondary | ICD-10-CM | POA: Diagnosis not present

## 2018-06-06 DIAGNOSIS — E871 Hypo-osmolality and hyponatremia: Secondary | ICD-10-CM | POA: Diagnosis not present

## 2018-06-06 DIAGNOSIS — I10 Essential (primary) hypertension: Secondary | ICD-10-CM

## 2018-06-06 DIAGNOSIS — E860 Dehydration: Secondary | ICD-10-CM

## 2018-06-06 DIAGNOSIS — K219 Gastro-esophageal reflux disease without esophagitis: Secondary | ICD-10-CM

## 2018-06-07 DIAGNOSIS — R55 Syncope and collapse: Secondary | ICD-10-CM | POA: Diagnosis not present

## 2018-06-07 DIAGNOSIS — R112 Nausea with vomiting, unspecified: Secondary | ICD-10-CM | POA: Diagnosis not present

## 2018-06-07 DIAGNOSIS — E860 Dehydration: Secondary | ICD-10-CM | POA: Diagnosis not present

## 2018-06-07 DIAGNOSIS — E871 Hypo-osmolality and hyponatremia: Secondary | ICD-10-CM | POA: Diagnosis not present

## 2018-06-08 DIAGNOSIS — R112 Nausea with vomiting, unspecified: Secondary | ICD-10-CM | POA: Diagnosis not present

## 2018-06-08 DIAGNOSIS — E860 Dehydration: Secondary | ICD-10-CM | POA: Diagnosis not present

## 2018-06-08 DIAGNOSIS — E871 Hypo-osmolality and hyponatremia: Secondary | ICD-10-CM | POA: Diagnosis not present

## 2018-06-08 DIAGNOSIS — R55 Syncope and collapse: Secondary | ICD-10-CM | POA: Diagnosis not present

## 2018-06-09 DIAGNOSIS — E860 Dehydration: Secondary | ICD-10-CM | POA: Diagnosis not present

## 2018-06-09 DIAGNOSIS — E871 Hypo-osmolality and hyponatremia: Secondary | ICD-10-CM | POA: Diagnosis not present

## 2018-06-09 DIAGNOSIS — R112 Nausea with vomiting, unspecified: Secondary | ICD-10-CM | POA: Diagnosis not present

## 2018-06-09 DIAGNOSIS — R55 Syncope and collapse: Secondary | ICD-10-CM | POA: Diagnosis not present

## 2018-06-10 DIAGNOSIS — E871 Hypo-osmolality and hyponatremia: Secondary | ICD-10-CM | POA: Diagnosis not present

## 2018-06-10 DIAGNOSIS — R112 Nausea with vomiting, unspecified: Secondary | ICD-10-CM | POA: Diagnosis not present

## 2018-06-10 DIAGNOSIS — E860 Dehydration: Secondary | ICD-10-CM | POA: Diagnosis not present

## 2018-06-10 DIAGNOSIS — R55 Syncope and collapse: Secondary | ICD-10-CM | POA: Diagnosis not present

## 2018-06-18 DIAGNOSIS — M545 Low back pain: Secondary | ICD-10-CM

## 2018-06-18 DIAGNOSIS — R112 Nausea with vomiting, unspecified: Secondary | ICD-10-CM

## 2018-06-18 DIAGNOSIS — R55 Syncope and collapse: Secondary | ICD-10-CM

## 2018-06-18 DIAGNOSIS — I10 Essential (primary) hypertension: Secondary | ICD-10-CM

## 2018-06-18 DIAGNOSIS — I480 Paroxysmal atrial fibrillation: Secondary | ICD-10-CM

## 2018-06-18 DIAGNOSIS — E1165 Type 2 diabetes mellitus with hyperglycemia: Secondary | ICD-10-CM

## 2018-06-19 DIAGNOSIS — I351 Nonrheumatic aortic (valve) insufficiency: Secondary | ICD-10-CM

## 2018-06-19 DIAGNOSIS — M545 Low back pain: Secondary | ICD-10-CM | POA: Diagnosis not present

## 2018-06-19 DIAGNOSIS — R112 Nausea with vomiting, unspecified: Secondary | ICD-10-CM | POA: Diagnosis not present

## 2018-06-19 DIAGNOSIS — N289 Disorder of kidney and ureter, unspecified: Secondary | ICD-10-CM | POA: Diagnosis not present

## 2018-06-19 DIAGNOSIS — R42 Dizziness and giddiness: Secondary | ICD-10-CM

## 2018-06-19 DIAGNOSIS — I361 Nonrheumatic tricuspid (valve) insufficiency: Secondary | ICD-10-CM

## 2018-06-20 DIAGNOSIS — N289 Disorder of kidney and ureter, unspecified: Secondary | ICD-10-CM | POA: Diagnosis not present

## 2018-06-20 DIAGNOSIS — R112 Nausea with vomiting, unspecified: Secondary | ICD-10-CM | POA: Diagnosis not present

## 2018-06-20 DIAGNOSIS — M545 Low back pain: Secondary | ICD-10-CM | POA: Diagnosis not present

## 2018-06-20 DIAGNOSIS — R42 Dizziness and giddiness: Secondary | ICD-10-CM | POA: Diagnosis not present

## 2018-06-21 DIAGNOSIS — M545 Low back pain: Secondary | ICD-10-CM | POA: Diagnosis not present

## 2018-06-21 DIAGNOSIS — R112 Nausea with vomiting, unspecified: Secondary | ICD-10-CM | POA: Diagnosis not present

## 2018-06-21 DIAGNOSIS — R42 Dizziness and giddiness: Secondary | ICD-10-CM | POA: Diagnosis not present

## 2018-06-21 DIAGNOSIS — N289 Disorder of kidney and ureter, unspecified: Secondary | ICD-10-CM | POA: Diagnosis not present

## 2019-11-04 DIAGNOSIS — M549 Dorsalgia, unspecified: Secondary | ICD-10-CM

## 2019-11-04 DIAGNOSIS — R55 Syncope and collapse: Secondary | ICD-10-CM

## 2019-11-04 DIAGNOSIS — I160 Hypertensive urgency: Secondary | ICD-10-CM

## 2019-11-04 DIAGNOSIS — R079 Chest pain, unspecified: Secondary | ICD-10-CM

## 2019-11-05 DIAGNOSIS — I160 Hypertensive urgency: Secondary | ICD-10-CM | POA: Diagnosis not present

## 2019-11-05 DIAGNOSIS — R079 Chest pain, unspecified: Secondary | ICD-10-CM | POA: Diagnosis not present

## 2019-11-05 DIAGNOSIS — R55 Syncope and collapse: Secondary | ICD-10-CM | POA: Diagnosis not present

## 2019-11-05 DIAGNOSIS — M549 Dorsalgia, unspecified: Secondary | ICD-10-CM | POA: Diagnosis not present

## 2020-01-11 ENCOUNTER — Encounter (HOSPITAL_COMMUNITY): Payer: Self-pay | Admitting: Emergency Medicine

## 2020-01-11 ENCOUNTER — Other Ambulatory Visit: Payer: Self-pay

## 2020-01-11 ENCOUNTER — Emergency Department (HOSPITAL_COMMUNITY): Payer: Medicaid Other

## 2020-01-11 ENCOUNTER — Emergency Department (HOSPITAL_COMMUNITY)
Admission: EM | Admit: 2020-01-11 | Discharge: 2020-01-11 | Disposition: A | Discharge status: Home / Self Care | Payer: Medicaid Other | Attending: Emergency Medicine | Admitting: Emergency Medicine

## 2020-01-11 DIAGNOSIS — Y929 Unspecified place or not applicable: Secondary | ICD-10-CM | POA: Insufficient documentation

## 2020-01-11 DIAGNOSIS — S20212A Contusion of left front wall of thorax, initial encounter: Secondary | ICD-10-CM | POA: Diagnosis not present

## 2020-01-11 DIAGNOSIS — W208XXA Other cause of strike by thrown, projected or falling object, initial encounter: Secondary | ICD-10-CM | POA: Insufficient documentation

## 2020-01-11 DIAGNOSIS — Z85528 Personal history of other malignant neoplasm of kidney: Secondary | ICD-10-CM | POA: Diagnosis not present

## 2020-01-11 DIAGNOSIS — S301XXA Contusion of abdominal wall, initial encounter: Secondary | ICD-10-CM | POA: Diagnosis not present

## 2020-01-11 DIAGNOSIS — I10 Essential (primary) hypertension: Secondary | ICD-10-CM | POA: Insufficient documentation

## 2020-01-11 DIAGNOSIS — Y939 Activity, unspecified: Secondary | ICD-10-CM | POA: Diagnosis not present

## 2020-01-11 DIAGNOSIS — Y999 Unspecified external cause status: Secondary | ICD-10-CM | POA: Insufficient documentation

## 2020-01-11 DIAGNOSIS — R0789 Other chest pain: Secondary | ICD-10-CM | POA: Diagnosis not present

## 2020-01-11 DIAGNOSIS — T07XXXA Unspecified multiple injuries, initial encounter: Secondary | ICD-10-CM | POA: Diagnosis present

## 2020-01-11 HISTORY — DX: Malignant (primary) neoplasm, unspecified: C80.1

## 2020-01-11 LAB — COMPREHENSIVE METABOLIC PANEL
ALT: 14 U/L (ref 0–44)
AST: 26 U/L (ref 15–41)
Albumin: 3.6 g/dL (ref 3.5–5.0)
Alkaline Phosphatase: 111 U/L (ref 38–126)
Anion gap: 7 (ref 5–15)
BUN: 11 mg/dL (ref 8–23)
CO2: 23 mmol/L (ref 22–32)
Calcium: 8.1 mg/dL — ABNORMAL LOW (ref 8.9–10.3)
Chloride: 103 mmol/L (ref 98–111)
Creatinine, Ser: 1.34 mg/dL — ABNORMAL HIGH (ref 0.61–1.24)
GFR calc Af Amer: >60 mL/min (ref >60)
GFR calc non Af Amer: 56 mL/min — ABNORMAL LOW (ref >60)
Glucose, Bld: 69 mg/dL — ABNORMAL LOW (ref 70–99)
Potassium: 4.7 mmol/L (ref 3.5–5.1)
Sodium: 133 mmol/L — ABNORMAL LOW (ref 135–145)
Total Bilirubin: 0.6 mg/dL (ref 0.3–1.2)
Total Protein: 7 g/dL (ref 6.5–8.1)

## 2020-01-11 LAB — CBC WITH DIFFERENTIAL/PLATELET
Abs Immature Granulocytes: 0.07 10*3/uL (ref 0.00–0.07)
Basophils Absolute: 0 10*3/uL (ref 0.0–0.1)
Basophils Relative: 0 %
Eosinophils Absolute: 0.2 10*3/uL (ref 0.0–0.5)
Eosinophils Relative: 2 %
HCT: 40.8 % (ref 39.0–52.0)
Hemoglobin: 13.2 g/dL (ref 13.0–17.0)
Immature Granulocytes: 1 %
Lymphocytes Relative: 15 %
Lymphs Abs: 1.4 10*3/uL (ref 0.7–4.0)
MCH: 27.5 pg (ref 26.0–34.0)
MCHC: 32.4 g/dL (ref 30.0–36.0)
MCV: 85 fL (ref 80.0–100.0)
Monocytes Absolute: 0.6 10*3/uL (ref 0.1–1.0)
Monocytes Relative: 6 %
Neutro Abs: 7.2 10*3/uL (ref 1.7–7.7)
Neutrophils Relative %: 76 %
Platelets: 140 10*3/uL — ABNORMAL LOW (ref 150–400)
RBC: 4.8 MIL/uL (ref 4.22–5.81)
RDW: 14.1 % (ref 11.5–15.5)
WBC: 9.5 10*3/uL (ref 4.0–10.5)
nRBC: 0 % (ref 0.0–0.2)

## 2020-01-11 LAB — I-STAT CHEM 8, ED
BUN: 13 mg/dL (ref 8–23)
Calcium, Ion: 0.97 mmol/L — ABNORMAL LOW (ref 1.15–1.40)
Chloride: 102 mmol/L (ref 98–111)
Creatinine, Ser: 1.4 mg/dL — ABNORMAL HIGH (ref 0.61–1.24)
Glucose, Bld: 70 mg/dL (ref 70–99)
HCT: 38 % — ABNORMAL LOW (ref 39.0–52.0)
Hemoglobin: 12.9 g/dL — ABNORMAL LOW (ref 13.0–17.0)
Potassium: 4.7 mmol/L (ref 3.5–5.1)
Sodium: 136 mmol/L (ref 135–145)
TCO2: 24 mmol/L (ref 22–32)

## 2020-01-11 LAB — PROTIME-INR
INR: 1 (ref 0.8–1.2)
Prothrombin Time: 12.9 seconds (ref 11.4–15.2)

## 2020-01-11 LAB — ABO/RH: ABO/RH(D): O NEG

## 2020-01-11 MED ORDER — IOHEXOL 300 MG/ML  SOLN
80.0000 mL | Freq: Once | INTRAMUSCULAR | Status: AC | PRN
  Administered 2020-01-11: 80 mL via INTRAVENOUS

## 2020-01-11 MED ORDER — HYDROMORPHONE HCL 1 MG/ML IJ SOLN
1.0000 mg | Freq: Once | INTRAMUSCULAR | Status: AC
  Administered 2020-01-11: 1 mg via INTRAVENOUS
  Filled 2020-01-11: qty 1

## 2020-01-11 MED ORDER — NAPROXEN 500 MG PO TABS
500.0000 mg | ORAL_TABLET | Freq: Two times a day (BID) | ORAL | 0 refills | Status: AC
Start: 2020-01-11 — End: ?

## 2020-01-11 MED ORDER — ACETAMINOPHEN ER 650 MG PO TBCR
650.0000 mg | EXTENDED_RELEASE_TABLET | Freq: Three times a day (TID) | ORAL | 0 refills | Status: AC | PRN
Start: 2020-01-11 — End: ?

## 2020-01-11 MED ORDER — FENTANYL CITRATE (PF) 100 MCG/2ML IJ SOLN
INTRAMUSCULAR | Status: AC
  Administered 2020-01-11: 100 ug
  Filled 2020-01-11: qty 2

## 2020-01-11 MED ORDER — SODIUM CHLORIDE 0.9 % IV BOLUS
500.0000 mL | Freq: Once | INTRAVENOUS | Status: AC
  Administered 2020-01-11: 500 mL via INTRAVENOUS
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
## 2020-01-11 NOTE — ED Notes (Signed)
Pt discharge instructions and prescriptions reviewed with the patient. The patient verbalized understanding of both. Pt discharged. 
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
## 2020-01-11 NOTE — ED Provider Notes (Signed)
[facility] EMERGENCY DEPARTMENT Provider Note   CSN: [id number] Arrival date & time: 01/11/20  1136     History Chief Complaint  Patient presents with  . Chest Injury    Kevin Zhang is a 64 y.o. male.  HPI    64 year old male comes in a chief complaint of chest injury.  Patient has history of hypertension and elevated BMI.  He reports that he was working under his pickup truck, when the truck slipped and fell onto his chest.  He was up to the truck for 2 to 3 minutes.  When EMS arrived patient was alert and responsive.  The patient complains primarily of pain on the left side.  He is not on any blood thinners and denies any substance use today.  Patient was started on oxygen per EMS.  Past Medical History:  Diagnosis Date  . Cancer [facility])    Kidney Cancer  . Hypertension     There are no problems to display for this patient.   History reviewed. No pertinent surgical history.     History reviewed. No pertinent family history.  Social History   Tobacco Use  . Smoking status: Never Smoker  . Smokeless tobacco: Current User    Types: Chew  Substance Use Topics  . Alcohol use: Not on file  . Drug use: Not on file    Home Medications Prior to Admission medications   Not on File    Allergies    Patient has no known allergies.  Review of Systems   Review of Systems  Constitutional: Positive for activity change.  Respiratory: Negative for shortness of breath.   Cardiovascular: Positive for chest pain.  Gastrointestinal: Positive for abdominal pain.  Skin: Positive for wound.  Allergic/Immunologic: Negative for immunocompromised state.  Hematological: Does not bruise/bleed easily.    Physical Exam Updated Vital Signs BP (!) 147/81   Pulse 76   Temp (!) 96.1 F (35.6 C) (Temporal)   Resp 13   Ht 5\' 9"  (1.753 m)   Wt (!) 141.5 kg   SpO2 92%   BMI 46.07 kg/m   Physical Exam Vitals and nursing note reviewed.  Constitutional:       Appearance: He is well-developed.  HENT:     Head: Atraumatic.  Eyes:     Extraocular Movements: Extraocular movements intact.     Pupils: Pupils are equal, round, and reactive to light.  Neck:     Comments: No midline c-spine tenderness, pt able to turn head to 45 degrees bilaterally without any pain and able to flex neck to the chest and extend without any pain or neurologic symptoms.  Cardiovascular:     Rate and Rhythm: Normal rate.  Pulmonary:     Effort: Pulmonary effort is normal. No respiratory distress.  Abdominal:     Tenderness: There is abdominal tenderness.     Comments: Tenderness over the left upper quadrant of the abdomen.  Musculoskeletal:        General: No swelling, tenderness, deformity or signs of injury.     Cervical back: Neck supple.  Skin:    General: Skin is warm.     Findings: Bruising present.  Neurological:     Mental Status: He is alert and oriented to person, place, and time.     ED Results / Procedures / Treatments   Labs (all labs ordered are listed, but only abnormal results are displayed) Labs Reviewed  COMPREHENSIVE METABOLIC PANEL - Abnormal; Notable for the following  components:      Result Value   Sodium 133 (*)    Glucose, Bld 69 (*)    Creatinine, Ser 1.34 (*)    Calcium 8.1 (*)    GFR calc non Af Amer 56 (*)    All other components within normal limits  CBC WITH DIFFERENTIAL/PLATELET - Abnormal; Notable for the following components:   Platelets 140 (*)    All other components within normal limits  I-STAT CHEM 8, ED - Abnormal; Notable for the following components:   Creatinine, Ser 1.40 (*)    Calcium, Ion 0.97 (*)    Hemoglobin 12.9 (*)    HCT 38.0 (*)    All other components within normal limits  PROTIME-INR  TYPE AND SCREEN    EKG None  Radiology CT Chest W Contrast  Result Date: 01/11/2020 CLINICAL DATA:  Trauma,, crush injury.  Vehicle fell on him EXAM: CT CHEST, ABDOMEN, AND PELVIS WITH CONTRAST TECHNIQUE:  Multidetector CT imaging of the chest, abdomen and pelvis was performed following the standard protocol during bolus administration of intravenous contrast. CONTRAST:  1mL OMNIPAQUE IOHEXOL 300 MG/ML  SOLN COMPARISON:  CT abdomen 06/16/2018 FINDINGS: CT CHEST FINDINGS Cardiovascular: No contour abnormality the thoracic aorta to suggest dissection or transsection. No mediastinal hematoma. Mediastinum/Nodes: Trachea and esophagus are normal. No pericardial fluid. Lungs/Pleura: No pneumothorax or pulmonary contusion. Minimal atelectasis at the posterior lower lobes Musculoskeletal: No rib fracture or sternal fracture identified. No scapular fracture CT ABDOMEN AND PELVIS FINDINGS Hepatobiliary: No focal hepatic lesion. Postcholecystectomy. No biliary dilatation. No hepatic laceration Pancreas: Pancreas is normal. No ductal dilatation. No pancreatic inflammation. Spleen: No splenic laceration.  No perisplenic fluid. Adrenals/urinary tract: Adrenal glands normal. Post LEFT nephrectomy. There is a soft tissue in in the LEFT nephrectomy bed measuring 5.03.2 cm which is not changed from CT 2018. Partial RIGHT nephrectomy anatomy. Stable postsurgical changes adjacent to the RIGHT kidney. RIGHT ureter normal. Bladder intact Stomach/Bowel: Stomach and bowel appear normal. No fluid within the mesentery. Vascular/Lymphatic: Abdominal aorta normal caliber. Iliac artery injury. Reproductive: Unremarkable Other: No fluid along the pericolic gutters or in the pelvis Musculoskeletal: No pelvic fracture spine fracture. Remote avulsion injury to the anterior column of the LEFT acetabulum. IMPRESSION: Chest Impression: 1. No evidence of aortic injury. 2. No pneumothorax or pulmonary contusion. 3. No rib fracture identified. Abdomen / Pelvis Impression: 1. No solid organ injury in the abdomen pelvis. 2. No evidence of acute pelvic fracture or spine fracture. Electronically Signed   By: Suzy Bouchard M.D.   On: 01/11/2020 12:20   CT  ABDOMEN PELVIS W CONTRAST  Result Date: 01/11/2020 CLINICAL DATA:  Trauma,, crush injury.  Vehicle fell on him EXAM: CT CHEST, ABDOMEN, AND PELVIS WITH CONTRAST TECHNIQUE: Multidetector CT imaging of the chest, abdomen and pelvis was performed following the standard protocol during bolus administration of intravenous contrast. CONTRAST:  40mL OMNIPAQUE IOHEXOL 300 MG/ML  SOLN COMPARISON:  CT abdomen 06/16/2018 FINDINGS: CT CHEST FINDINGS Cardiovascular: No contour abnormality the thoracic aorta to suggest dissection or transsection. No mediastinal hematoma. Mediastinum/Nodes: Trachea and esophagus are normal. No pericardial fluid. Lungs/Pleura: No pneumothorax or pulmonary contusion. Minimal atelectasis at the posterior lower lobes Musculoskeletal: No rib fracture or sternal fracture identified. No scapular fracture CT ABDOMEN AND PELVIS FINDINGS Hepatobiliary: No focal hepatic lesion. Postcholecystectomy. No biliary dilatation. No hepatic laceration Pancreas: Pancreas is normal. No ductal dilatation. No pancreatic inflammation. Spleen: No splenic laceration.  No perisplenic fluid. Adrenals/urinary tract: Adrenal glands normal. Post LEFT  nephrectomy. There is a soft tissue in in the LEFT nephrectomy bed measuring 5.03.2 cm which is not changed from CT 2018. Partial RIGHT nephrectomy anatomy. Stable postsurgical changes adjacent to the RIGHT kidney. RIGHT ureter normal. Bladder intact Stomach/Bowel: Stomach and bowel appear normal. No fluid within the mesentery. Vascular/Lymphatic: Abdominal aorta normal caliber. Iliac artery injury. Reproductive: Unremarkable Other: No fluid along the pericolic gutters or in the pelvis Musculoskeletal: No pelvic fracture spine fracture. Remote avulsion injury to the anterior column of the LEFT acetabulum. IMPRESSION: Chest Impression: 1. No evidence of aortic injury. 2. No pneumothorax or pulmonary contusion. 3. No rib fracture identified. Abdomen / Pelvis Impression: 1. No solid  organ injury in the abdomen pelvis. 2. No evidence of acute pelvic fracture or spine fracture. Electronically Signed   By: Suzy Bouchard M.D.   On: 01/11/2020 12:20    Procedures Procedures (including critical care time)  Medications Ordered in ED Medications  fentaNYL (SUBLIMAZE) 100 MCG/2ML injection (100 mcg  Given 01/11/20 1210)  sodium chloride 0.9 % bolus 500 mL (500 mLs Intravenous New Bag/Given 01/11/20 1210)  iohexol (OMNIPAQUE) 300 MG/ML solution 80 mL (80 mLs Intravenous Contrast Given 01/11/20 1206)  HYDROmorphone (DILAUDID) injection 1 mg (1 mg Intravenous Given 01/11/20 1239)    ED Course  I have reviewed the triage vital signs and the nursing notes.  Pertinent labs & imaging results that were available during my care of the patient were reviewed by me and considered in my medical decision making (see chart for details).    MDM Rules/Calculators/A&P                      64 year old comes in a chief complaint of chest wall pain/abdominal wall pain. He essentially has a crush injury from being pinned under his pickup truck.  Level 1 trauma was called by EMS.  Patient on exam does not have any hypoxia and he has stable vital signs and no neurovascular compromise.  Patient is having tenderness over the left upper quadrant and there is some ecchymosis in that area.  Concerns for splenic injury versus rib fractures versus occult pneumothorax.  Appropriate scans ordered and they are normal.  Patient's pain is now under control.  Results of the ED work-up discussed with him.  Patient is stable for discharge.  Final Clinical Impression(s) / ED Diagnoses Final diagnoses:  Chest wall pain  Contusion of abdominal wall, initial encounter  Contusion of rib on left side, initial encounter    Rx / DC Orders ED Discharge Orders    None       Varney Biles, MD 01/11/20 1428

## 2020-01-11 NOTE — Consult Note (Signed)
Kevin Zhang July 20, 1956  [id number].    Requesting MD: Dr. Varney Biles Chief Complaint/Reason for Consult: level 1 trauma  HPI:  This is a 64 yo white male with a history of RCC, s/p left nephrectomy, along with a partial nephrectomy on the right, unclear reasons, HTN, morbid obesity who was working on his pick up truck this morning.  Apparently the jack wasn't secured and the truck fell on him and landed on his left chest/upp abdominal area.  He was brought in as a level 1 trauma despite normal vitals and GCS.  Upon arrival, he was hemodynamically stable but c/o LUQ abdominal pain.  He was thoroughly worked up with labs and a CT scans.  ROS: ROS: Please see HPI, otherwise all other systems have been reviewed and are negative.  History reviewed. No pertinent family history.  Past Medical History:  Diagnosis Date   Cancer ([facility])    Kidney Cancer   Hypertension     Past Surgical History:  Procedure Laterality Date   CHOLECYSTECTOMY     NEPHRECTOMY Left     Social History:  reports that he has never smoked. His smokeless tobacco use includes chew. He reports previous alcohol use. He reports that he does not use drugs.  Allergies: No Known Allergies  (Not in a hospital admission)    Physical Exam: Blood pressure (!) 166/79, pulse 71, temperature (!) 96.1 F (35.6 C), temperature source Temporal, resp. rate 12, height 5\' 9"  (1.753 m), weight (!) 141.5 kg, SpO2 (!) 88 %. General: pleasant, morbidly obese white male who is laying in bed in mild distress secondary to pain HEENT: head is normocephalic, atraumatic.  Sclera are noninjected.  PERRL.  Ears and nose without any masses or lesions.  Mouth is pink and moist.  Neck: no obvious thyromegaly, neck is nontender Heart: regular, rate, and rhythm.  Normal s1,s2. No obvious murmurs, gallops, or rubs noted.  Palpable radial and pedal pulses bilaterally Lungs: CTAB, no wheezes, rhonchi, or rales noted.  Respiratory  effort nonlabored.  Some left chest pain to palpation, but no crepitus or instability. Abd: soft, tender in the LUQ with a small amount of ecchymosis, ND, but morbidly obese, +BS, no masses, hernias, or organomegaly.  Multiple scars from previous surgeries MS: all 4 extremities are symmetrical with no cyanosis, clubbing, or edema, except left hand with some contractures and atrophy from previous injury.  Neck is nontender, back is nontender with no stepoffs or other injuries noted Skin: warm and dry with no masses, lesions, or rashes Neuro: Cranial nerves 2-12 grossly intact, sensation is normal throughout Psych: A&Ox3 with an appropriate affect.   Results for orders placed or performed during the hospital encounter of 01/11/20 (from the past 48 hour(s))  Comprehensive metabolic panel     Status: Abnormal   Collection Time: 01/11/20 11:40 AM  Result Value Ref Range   Sodium 133 (L) 135 - 145 mmol/L   Potassium 4.7 3.5 - 5.1 mmol/L   Chloride 103 98 - 111 mmol/L   CO2 23 22 - 32 mmol/L   Glucose, Bld 69 (L) 70 - 99 mg/dL    Comment: Glucose reference range applies only to samples taken after fasting for at least 8 hours.   BUN 11 8 - 23 mg/dL   Creatinine, Ser 1.34 (H) 0.61 - 1.24 mg/dL   Calcium 8.1 (L) 8.9 - 10.3 mg/dL   Total Protein 7.0 6.5 - 8.1 g/dL   Albumin 3.6 3.5 - 5.0 g/dL  AST 26 15 - 41 U/L   ALT 14 0 - 44 U/L   Alkaline Phosphatase 111 38 - 126 U/L   Total Bilirubin 0.6 0.3 - 1.2 mg/dL   GFR calc non Af Amer 56 (L) >60 mL/min   GFR calc Af Amer >60 >60 mL/min   Anion gap 7 5 - 15    Comment: Performed at [facility] [address] [place], [place] [address]  CBC with Differential     Status: Abnormal   Collection Time: 01/11/20 11:40 AM  Result Value Ref Range   WBC 9.5 4.0 - 10.5 K/uL   RBC 4.80 4.22 - 5.81 MIL/uL   Hemoglobin 13.2 13.0 - 17.0 g/dL   HCT 40.8 39.0 - 52.0 %   MCV 85.0 80.0 - 100.0 fL   MCH 27.5 26.0 - 34.0 pg   MCHC 32.4 30.0 - 36.0 g/dL    RDW 14.1 11.5 - 15.5 %   Platelets 140 (L) 150 - 400 K/uL   nRBC 0.0 0.0 - 0.2 %   Neutrophils Relative % 76 %   Neutro Abs 7.2 1.7 - 7.7 K/uL   Lymphocytes Relative 15 %   Lymphs Abs 1.4 0.7 - 4.0 K/uL   Monocytes Relative 6 %   Monocytes Absolute 0.6 0.1 - 1.0 K/uL   Eosinophils Relative 2 %   Eosinophils Absolute 0.2 0.0 - 0.5 K/uL   Basophils Relative 0 %   Basophils Absolute 0.0 0.0 - 0.1 K/uL   Immature Granulocytes 1 %   Abs Immature Granulocytes 0.07 0.00 - 0.07 K/uL    Comment: Performed at [facility] [address] [place], [place] [address]  Protime-INR     Status: None   Collection Time: 01/11/20 11:40 AM  Result Value Ref Range   Prothrombin Time 12.9 11.4 - 15.2 seconds   INR 1.0 0.8 - 1.2    Comment: (NOTE) INR goal varies based on device and disease states. Performed at [facility], [place] [address], [place], [place] [address]   Type and screen     Status: None (Preliminary result)   Collection Time: 01/11/20 11:40 AM  Result Value Ref Range   ABO/RH(D) O NEG    Antibody Screen PENDING    Sample Expiration      01/14/2020,2359 Performed at [facility], [place] [address], [place], [place] [address]   I-stat chem 8, ED (not at [facility] or [facility])     Status: Abnormal   Collection Time: 01/11/20 11:43 AM  Result Value Ref Range   Sodium 136 135 - 145 mmol/L   Potassium 4.7 3.5 - 5.1 mmol/L   Chloride 102 98 - 111 mmol/L   BUN 13 8 - 23 mg/dL   Creatinine, Ser 1.40 (H) 0.61 - 1.24 mg/dL   Glucose, Bld 70 70 - 99 mg/dL    Comment: Glucose reference range applies only to samples taken after fasting for at least 8 hours.   Calcium, Ion 0.97 (L) 1.15 - 1.40 mmol/L   TCO2 24 22 - 32 mmol/L   Hemoglobin 12.9 (L) 13.0 - 17.0 g/dL   HCT 38.0 (L) 39.0 - 52.0 %   CT Chest W Contrast  Result Date: 01/11/2020 CLINICAL DATA:  Trauma,, crush injury.  Vehicle fell on him EXAM: CT CHEST, ABDOMEN, AND PELVIS WITH CONTRAST TECHNIQUE: Multidetector CT  imaging of the chest, abdomen and pelvis was performed following the standard protocol during bolus administration of intravenous contrast.  CONTRAST:  58mL OMNIPAQUE IOHEXOL 300 MG/ML  SOLN COMPARISON:  CT abdomen 06/16/2018 FINDINGS: CT CHEST FINDINGS Cardiovascular: No contour abnormality the thoracic aorta to suggest dissection or transsection. No mediastinal hematoma. Mediastinum/Nodes: Trachea and esophagus are normal. No pericardial fluid. Lungs/Pleura: No pneumothorax or pulmonary contusion. Minimal atelectasis at the posterior lower lobes Musculoskeletal: No rib fracture or sternal fracture identified. No scapular fracture CT ABDOMEN AND PELVIS FINDINGS Hepatobiliary: No focal hepatic lesion. Postcholecystectomy. No biliary dilatation. No hepatic laceration Pancreas: Pancreas is normal. No ductal dilatation. No pancreatic inflammation. Spleen: No splenic laceration.  No perisplenic fluid. Adrenals/urinary tract: Adrenal glands normal. Post LEFT nephrectomy. There is a soft tissue in in the LEFT nephrectomy bed measuring 5.03.2 cm which is not changed from CT 2018. Partial RIGHT nephrectomy anatomy. Stable postsurgical changes adjacent to the RIGHT kidney. RIGHT ureter normal. Bladder intact Stomach/Bowel: Stomach and bowel appear normal. No fluid within the mesentery. Vascular/Lymphatic: Abdominal aorta normal caliber. Iliac artery injury. Reproductive: Unremarkable Other: No fluid along the pericolic gutters or in the pelvis Musculoskeletal: No pelvic fracture spine fracture. Remote avulsion injury to the anterior column of the LEFT acetabulum. IMPRESSION: Chest Impression: 1. No evidence of aortic injury. 2. No pneumothorax or pulmonary contusion. 3. No rib fracture identified. Abdomen / Pelvis Impression: 1. No solid organ injury in the abdomen pelvis. 2. No evidence of acute pelvic fracture or spine fracture. Electronically Signed   By: Suzy Bouchard M.D.   On: 01/11/2020 12:20   CT ABDOMEN PELVIS W  CONTRAST  Result Date: 01/11/2020 CLINICAL DATA:  Trauma,, crush injury.  Vehicle fell on him EXAM: CT CHEST, ABDOMEN, AND PELVIS WITH CONTRAST TECHNIQUE: Multidetector CT imaging of the chest, abdomen and pelvis was performed following the standard protocol during bolus administration of intravenous contrast. CONTRAST:  66mL OMNIPAQUE IOHEXOL 300 MG/ML  SOLN COMPARISON:  CT abdomen 06/16/2018 FINDINGS: CT CHEST FINDINGS Cardiovascular: No contour abnormality the thoracic aorta to suggest dissection or transsection. No mediastinal hematoma. Mediastinum/Nodes: Trachea and esophagus are normal. No pericardial fluid. Lungs/Pleura: No pneumothorax or pulmonary contusion. Minimal atelectasis at the posterior lower lobes Musculoskeletal: No rib fracture or sternal fracture identified. No scapular fracture CT ABDOMEN AND PELVIS FINDINGS Hepatobiliary: No focal hepatic lesion. Postcholecystectomy. No biliary dilatation. No hepatic laceration Pancreas: Pancreas is normal. No ductal dilatation. No pancreatic inflammation. Spleen: No splenic laceration.  No perisplenic fluid. Adrenals/urinary tract: Adrenal glands normal. Post LEFT nephrectomy. There is a soft tissue in in the LEFT nephrectomy bed measuring 5.03.2 cm which is not changed from CT 2018. Partial RIGHT nephrectomy anatomy. Stable postsurgical changes adjacent to the RIGHT kidney. RIGHT ureter normal. Bladder intact Stomach/Bowel: Stomach and bowel appear normal. No fluid within the mesentery. Vascular/Lymphatic: Abdominal aorta normal caliber. Iliac artery injury. Reproductive: Unremarkable Other: No fluid along the pericolic gutters or in the pelvis Musculoskeletal: No pelvic fracture spine fracture. Remote avulsion injury to the anterior column of the LEFT acetabulum. IMPRESSION: Chest Impression: 1. No evidence of aortic injury. 2. No pneumothorax or pulmonary contusion. 3. No rib fracture identified. Abdomen / Pelvis Impression: 1. No solid organ injury in  the abdomen pelvis. 2. No evidence of acute pelvic fracture or spine fracture. Electronically Signed   By: Suzy Bouchard M.D.   On: 01/11/2020 12:20      Assessment/Plan Truck fell on him Abdominal pain/chest pain - after level 1 workup in the ED with labs and CT scan, he has been found to have no acute injury from his accident.  This  was discussed with Dr. Kathrynn Humble, [place].  Dr. Bobbye Morton and I reviewed all of his imaging with the radiologist.  No further trauma plans.  Further disposition per ED.  Henreitta Cea, [facility] 01/11/2020, 12:47 PM Please see Amion for pager number during day hours 7:00am-4:30pm or 7:00am -11:30am on weekends

## 2020-01-12 LAB — TYPE AND SCREEN
ABO/RH(D): O NEG
Antibody Screen: NEGATIVE

## 2020-12-06 DIAGNOSIS — R55 Syncope and collapse: Secondary | ICD-10-CM | POA: Diagnosis not present

## 2021-05-26 IMAGING — DX DG PORTABLE PELVIS
1 series · 1 of 1 positions shown · non-contrast
Comparison: CT same day

CLINICAL DATA: Blunt trauma

EXAM:
PORTABLE PELVIS 1-2 VIEWS

[pelvis ap]
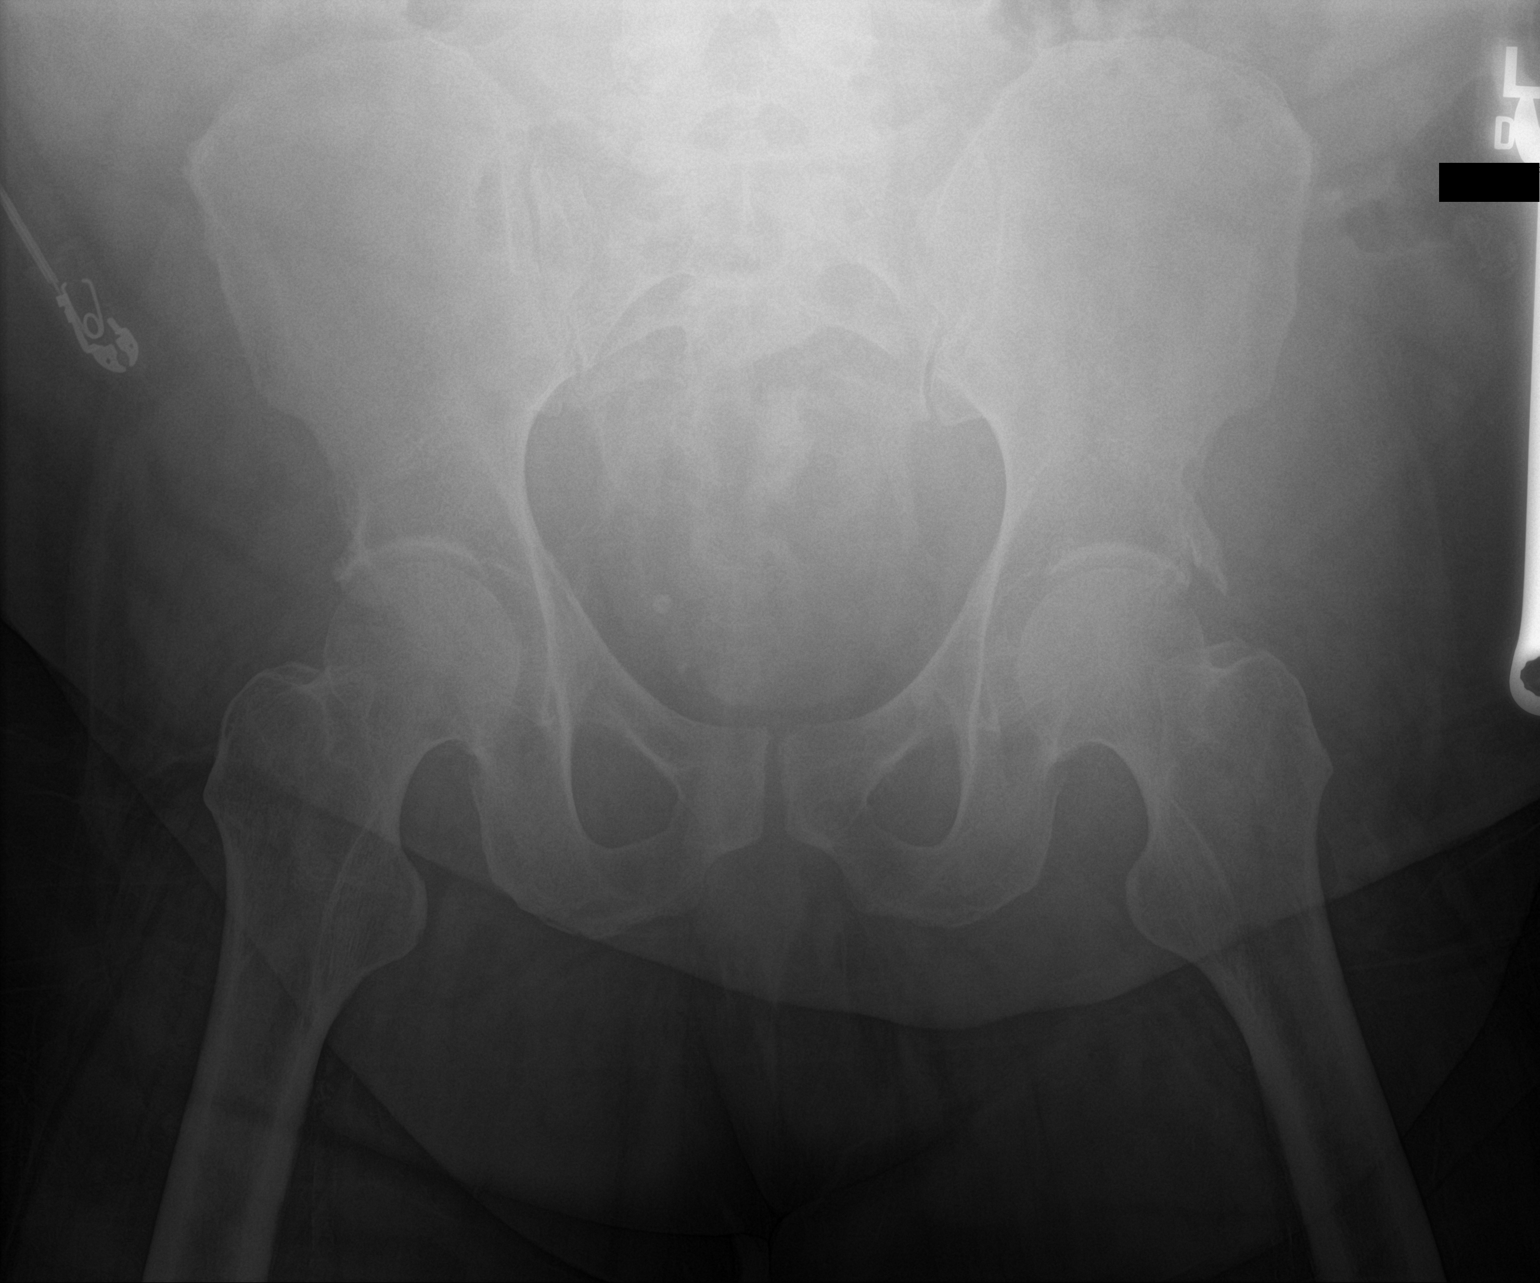

[1 of 1 positions shown; findings below may reference images not displayed]

FINDINGS: Fragmentation along the lateral wall of the LEFT acetabulum appears
chronic on comparison same day CT. Hips are located. No pelvic
fracture
IMPRESSION: No pelvic fracture identified.

## 2021-05-26 IMAGING — DX DG CHEST 1V PORT
1 series · 1 of 1 positions shown · non-contrast
Comparison: None.

CLINICAL DATA: Blunt trauma

EXAM:
PORTABLE CHEST 1 VIEW

[chest ap]
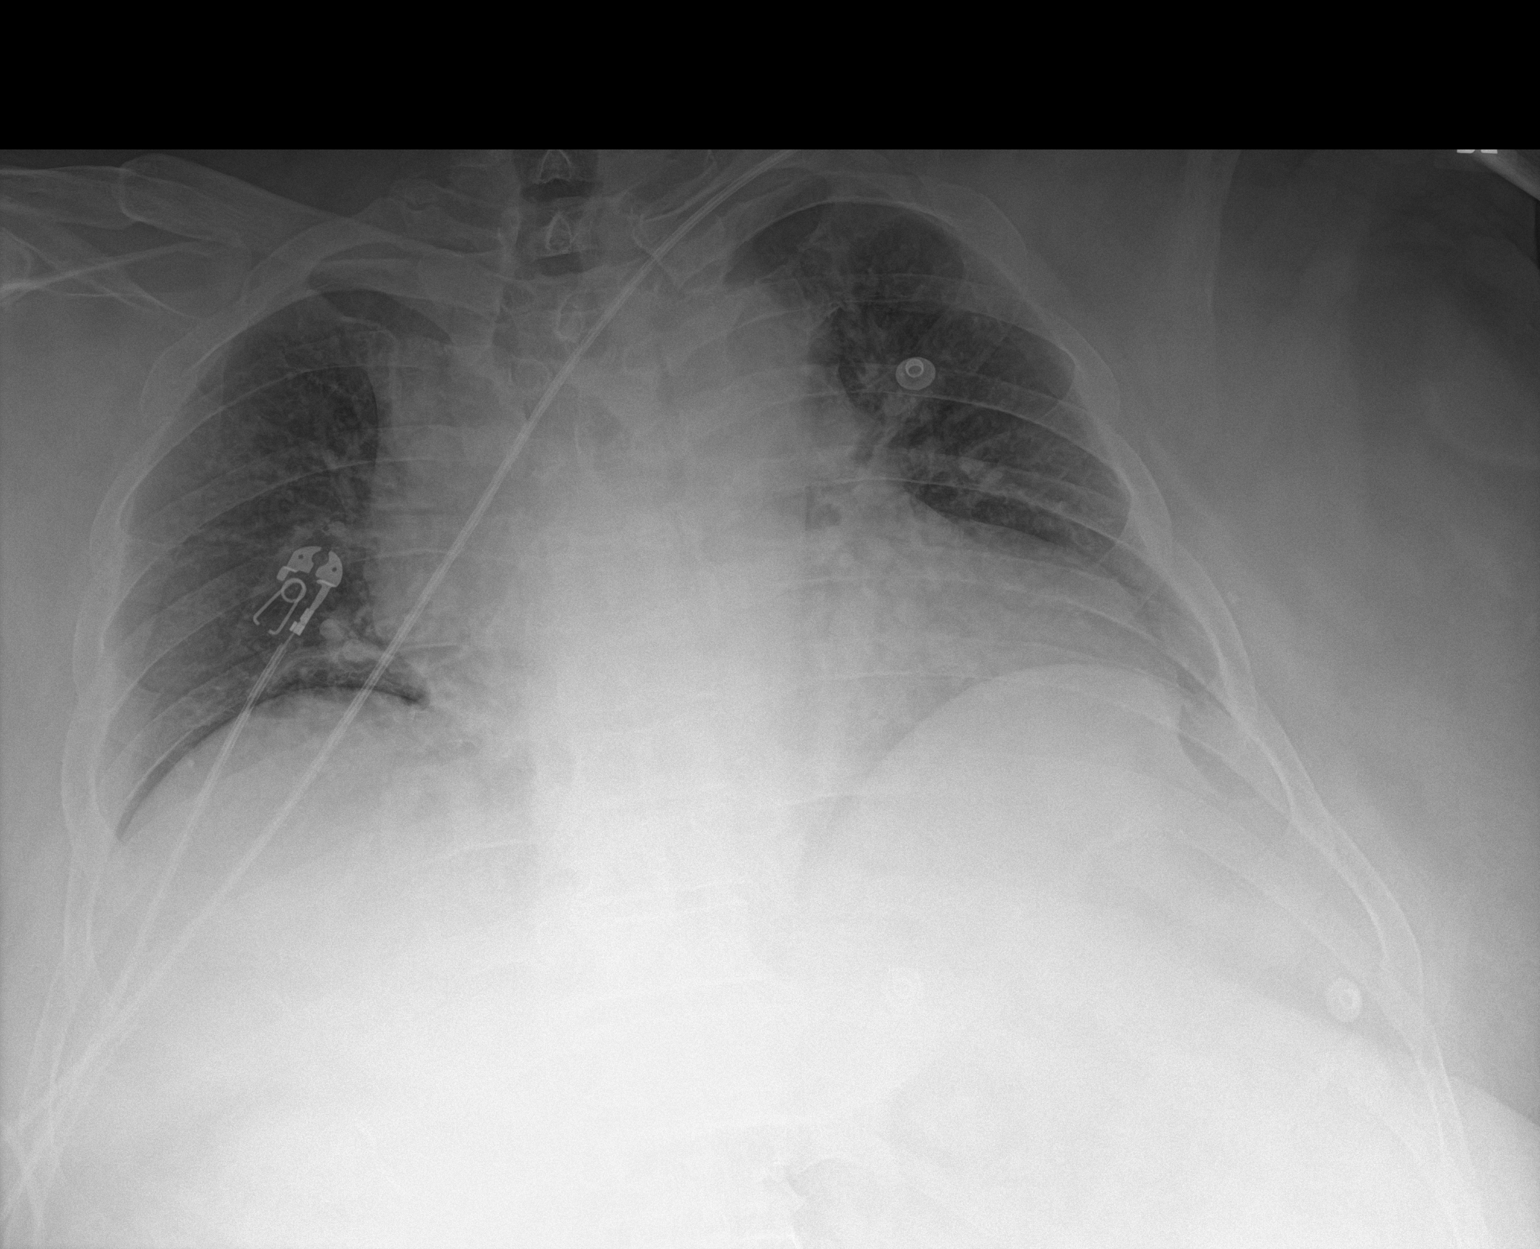

[1 of 1 positions shown; findings below may reference images not displayed]

FINDINGS: Upper mediastinum is widened. No evidence of injury on comparison
CT. No pneumothorax or pulmonary contusion. No fracture identified.
IMPRESSION: No evidence of thoracic trauma.  See accompanying CT.

## 2022-07-06 DEATH — deceased
